# Patient Record
Sex: Female | Born: 1950 | Hispanic: No | Smoking: Never smoker
Health system: Southern US, Community
[De-identification: ages and names within clinical notes are randomized; demographics above are authoritative.]

## PROBLEM LIST (undated history)

## (undated) DIAGNOSIS — R42 Dizziness and giddiness: Secondary | ICD-10-CM

---

## 2017-12-24 ENCOUNTER — Other Ambulatory Visit: Payer: Self-pay | Admitting: Family Medicine

## 2017-12-24 DIAGNOSIS — Z1231 Encounter for screening mammogram for malignant neoplasm of breast: Secondary | ICD-10-CM

## 2018-12-03 ENCOUNTER — Other Ambulatory Visit: Payer: Self-pay | Admitting: Family Medicine

## 2018-12-03 DIAGNOSIS — Z13820 Encounter for screening for osteoporosis: Secondary | ICD-10-CM

## 2018-12-03 DIAGNOSIS — Z1231 Encounter for screening mammogram for malignant neoplasm of breast: Secondary | ICD-10-CM

## 2019-01-11 ENCOUNTER — Other Ambulatory Visit: Payer: Self-pay

## 2019-01-11 ENCOUNTER — Encounter: Payer: Self-pay | Admitting: Emergency Medicine

## 2019-01-11 DIAGNOSIS — N179 Acute kidney failure, unspecified: Secondary | ICD-10-CM | POA: Diagnosis present

## 2019-01-11 DIAGNOSIS — J9601 Acute respiratory failure with hypoxia: Secondary | ICD-10-CM | POA: Diagnosis present

## 2019-01-11 DIAGNOSIS — U071 COVID-19: Principal | ICD-10-CM | POA: Diagnosis present

## 2019-01-11 DIAGNOSIS — E860 Dehydration: Secondary | ICD-10-CM | POA: Diagnosis present

## 2019-01-11 DIAGNOSIS — Z79899 Other long term (current) drug therapy: Secondary | ICD-10-CM

## 2019-01-11 DIAGNOSIS — R8290 Unspecified abnormal findings in urine: Secondary | ICD-10-CM | POA: Diagnosis present

## 2019-01-11 DIAGNOSIS — J1282 Pneumonia due to coronavirus disease 2019: Secondary | ICD-10-CM | POA: Diagnosis present

## 2019-01-11 LAB — TROPONIN I (HIGH SENSITIVITY): Troponin I (High Sensitivity): 8 ng/L (ref <18)

## 2019-01-11 LAB — URINALYSIS, COMPLETE (UACMP) WITH MICROSCOPIC
Bilirubin Urine: NEGATIVE
Glucose, UA: NEGATIVE mg/dL
Hgb urine dipstick: NEGATIVE
Ketones, ur: NEGATIVE mg/dL
Nitrite: NEGATIVE
Protein, ur: 100 mg/dL — AB
Specific Gravity, Urine: 1.018 (ref 1.005–1.030)
pH: 6 (ref 5.0–8.0)

## 2019-01-11 LAB — BASIC METABOLIC PANEL
Anion gap: 15 (ref 5–15)
BUN: 43 mg/dL — ABNORMAL HIGH (ref 8–23)
CO2: 22 mmol/L (ref 22–32)
Calcium: 9.1 mg/dL (ref 8.9–10.3)
Chloride: 97 mmol/L — ABNORMAL LOW (ref 98–111)
Creatinine, Ser: 1.64 mg/dL — ABNORMAL HIGH (ref 0.44–1.00)
GFR calc Af Amer: 37 mL/min — ABNORMAL LOW (ref >60)
GFR calc non Af Amer: 32 mL/min — ABNORMAL LOW (ref >60)
Glucose, Bld: 107 mg/dL — ABNORMAL HIGH (ref 70–99)
Potassium: 4.4 mmol/L (ref 3.5–5.1)
Sodium: 134 mmol/L — ABNORMAL LOW (ref 135–145)

## 2019-01-11 LAB — CBC
HCT: 44.3 % (ref 36.0–46.0)
Hemoglobin: 13.9 g/dL (ref 12.0–15.0)
MCH: 25.8 pg — ABNORMAL LOW (ref 26.0–34.0)
MCHC: 31.4 g/dL (ref 30.0–36.0)
MCV: 82.3 fL (ref 80.0–100.0)
Platelets: 164 10*3/uL (ref 150–400)
RBC: 5.38 MIL/uL — ABNORMAL HIGH (ref 3.87–5.11)
RDW: 14.3 % (ref 11.5–15.5)
WBC: 4.8 10*3/uL (ref 4.0–10.5)
nRBC: 0 % (ref 0.0–0.2)

## 2019-01-11 LAB — HEPATIC FUNCTION PANEL
ALT: 27 U/L (ref 0–44)
AST: 41 U/L (ref 15–41)
Albumin: 3.9 g/dL (ref 3.5–5.0)
Alkaline Phosphatase: 64 U/L (ref 38–126)
Bilirubin, Direct: 0.1 mg/dL (ref 0.0–0.2)
Indirect Bilirubin: 0.8 mg/dL (ref 0.3–0.9)
Total Bilirubin: 0.9 mg/dL (ref 0.3–1.2)
Total Protein: 8.7 g/dL — ABNORMAL HIGH (ref 6.5–8.1)

## 2019-01-11 NOTE — ED Notes (Signed)
Patient called for a room with no answer. 

## 2019-01-11 NOTE — ED Triage Notes (Signed)
Pt arrived via POV with reports of 5 days of body aches, fever, loss of sense of smell and appetite, c/o weakness and fatigue.   Pt states was tested for COVID 2 weeks ago x 2 and was negative.

## 2019-01-12 ENCOUNTER — Encounter: Payer: Self-pay | Admitting: Internal Medicine

## 2019-01-12 ENCOUNTER — Inpatient Hospital Stay
Admission: EM | Admit: 2019-01-12 | Discharge: 2019-01-14 | DRG: 177 | Disposition: A | Discharge status: Home / Self Care | Payer: Medicare HMO | Attending: Hospitalist | Admitting: Hospitalist

## 2019-01-12 ENCOUNTER — Emergency Department: Payer: Medicare HMO

## 2019-01-12 DIAGNOSIS — Z79899 Other long term (current) drug therapy: Secondary | ICD-10-CM | POA: Diagnosis not present

## 2019-01-12 DIAGNOSIS — U071 COVID-19: Secondary | ICD-10-CM | POA: Diagnosis present

## 2019-01-12 DIAGNOSIS — E860 Dehydration: Secondary | ICD-10-CM | POA: Diagnosis present

## 2019-01-12 DIAGNOSIS — J1282 Pneumonia due to coronavirus disease 2019: Secondary | ICD-10-CM | POA: Diagnosis present

## 2019-01-12 DIAGNOSIS — R8290 Unspecified abnormal findings in urine: Secondary | ICD-10-CM | POA: Diagnosis present

## 2019-01-12 DIAGNOSIS — J9601 Acute respiratory failure with hypoxia: Secondary | ICD-10-CM | POA: Diagnosis present

## 2019-01-12 DIAGNOSIS — N179 Acute kidney failure, unspecified: Secondary | ICD-10-CM | POA: Diagnosis present

## 2019-01-12 DIAGNOSIS — J9600 Acute respiratory failure, unspecified whether with hypoxia or hypercapnia: Secondary | ICD-10-CM

## 2019-01-12 HISTORY — DX: Dizziness and giddiness: R42

## 2019-01-12 LAB — ABO/RH: ABO/RH(D): O POS

## 2019-01-12 LAB — RESPIRATORY PANEL BY RT PCR (FLU A&B, COVID)
Influenza A by PCR: NEGATIVE
Influenza B by PCR: NEGATIVE
SARS Coronavirus 2 by RT PCR: POSITIVE — AB

## 2019-01-12 LAB — POC SARS CORONAVIRUS 2 AG: SARS Coronavirus 2 Ag: POSITIVE — AB

## 2019-01-12 LAB — TROPONIN I (HIGH SENSITIVITY): Troponin I (High Sensitivity): 8 ng/L (ref <18)

## 2019-01-12 LAB — HIV ANTIBODY (ROUTINE TESTING W REFLEX): HIV Screen 4th Generation wRfx: NONREACTIVE

## 2019-01-12 LAB — CREATININE, SERUM
Creatinine, Ser: 1.76 mg/dL — ABNORMAL HIGH (ref 0.44–1.00)
GFR calc Af Amer: 34 mL/min — ABNORMAL LOW (ref >60)
GFR calc non Af Amer: 30 mL/min — ABNORMAL LOW (ref >60)

## 2019-01-12 MED ORDER — DEXAMETHASONE SODIUM PHOSPHATE 10 MG/ML IJ SOLN
6.0000 mg | INTRAMUSCULAR | Status: DC
  Administered 2019-01-13 (×2): 6 mg via INTRAVENOUS
  Filled 2019-01-12 (×2): qty 1

## 2019-01-12 MED ORDER — SODIUM CHLORIDE 0.9 % IV SOLN
100.0000 mg | Freq: Every day | INTRAVENOUS | Status: DC
  Administered 2019-01-13 – 2019-01-14 (×2): 100 mg via INTRAVENOUS
  Filled 2019-01-12 (×3): qty 20

## 2019-01-12 MED ORDER — SODIUM CHLORIDE 0.9 % IV BOLUS
1000.0000 mL | Freq: Once | INTRAVENOUS | Status: AC
  Administered 2019-01-12: 02:00:00 1000 mL via INTRAVENOUS

## 2019-01-12 MED ORDER — ENOXAPARIN SODIUM 40 MG/0.4ML ~~LOC~~ SOLN
40.0000 mg | SUBCUTANEOUS | Status: DC
  Administered 2019-01-13 (×2): 40 mg via SUBCUTANEOUS
  Filled 2019-01-12 (×2): qty 0.4

## 2019-01-12 MED ORDER — SODIUM CHLORIDE 0.9 % IV SOLN: 200.0000 mg | Freq: Once | INTRAVENOUS | Status: DC

## 2019-01-12 MED ORDER — ZINC SULFATE 220 (50 ZN) MG PO CAPS
220.0000 mg | ORAL_CAPSULE | Freq: Every day | ORAL | Status: DC
  Administered 2019-01-12 – 2019-01-14 (×3): 220 mg via ORAL
  Filled 2019-01-12 (×3): qty 1

## 2019-01-12 MED ORDER — ADULT MULTIVITAMIN W/MINERALS CH
1.0000 | ORAL_TABLET | Freq: Every day | ORAL | Status: DC
  Administered 2019-01-12 – 2019-01-14 (×3): 1 via ORAL
  Filled 2019-01-12 (×3): qty 1

## 2019-01-12 MED ORDER — SODIUM CHLORIDE 0.9 % IV BOLUS
1000.0000 mL | Freq: Once | INTRAVENOUS | Status: AC
  Administered 2019-01-12: 1000 mL via INTRAVENOUS

## 2019-01-12 MED ORDER — SODIUM CHLORIDE 0.9 % IV SOLN: 100.0000 mg | Freq: Every day | INTRAVENOUS | Status: DC

## 2019-01-12 MED ORDER — DEXAMETHASONE SODIUM PHOSPHATE 10 MG/ML IJ SOLN
10.0000 mg | Freq: Once | INTRAMUSCULAR | Status: AC
  Administered 2019-01-12: 10 mg via INTRAVENOUS
  Filled 2019-01-12: qty 1

## 2019-01-12 MED ORDER — ALBUTEROL SULFATE HFA 108 (90 BASE) MCG/ACT IN AERS
2.0000 | INHALATION_SPRAY | Freq: Four times a day (QID) | RESPIRATORY_TRACT | Status: DC
  Administered 2019-01-12 – 2019-01-14 (×9): 2 via RESPIRATORY_TRACT
  Filled 2019-01-12 (×2): qty 6.7

## 2019-01-12 MED ORDER — GUAIFENESIN-DM 100-10 MG/5ML PO SYRP
10.0000 mL | ORAL_SOLUTION | ORAL | Status: DC | PRN
  Filled 2019-01-12: qty 10

## 2019-01-12 MED ORDER — ASCORBIC ACID 500 MG PO TABS
500.0000 mg | ORAL_TABLET | Freq: Every day | ORAL | Status: DC
  Administered 2019-01-12 – 2019-01-14 (×3): 500 mg via ORAL
  Filled 2019-01-12 (×3): qty 1

## 2019-01-12 MED ORDER — SODIUM CHLORIDE 0.9 % IV SOLN
1.0000 g | INTRAVENOUS | Status: DC
  Administered 2019-01-12: 1 g via INTRAVENOUS
  Filled 2019-01-12: qty 10

## 2019-01-12 MED ORDER — SODIUM CHLORIDE 0.9 % IV SOLN
200.0000 mg | Freq: Once | INTRAVENOUS | Status: AC
  Administered 2019-01-12: 04:00:00 200 mg via INTRAVENOUS
  Filled 2019-01-12: qty 200

## 2019-01-12 NOTE — ED Notes (Signed)
Pt provided lunch tray. Sitting up at this time.

## 2019-01-12 NOTE — Progress Notes (Signed)
PROGRESS NOTE    Aimee Williamson  WUJ:[id number] DOB: 1950-02-22 DOA: 01/12/2019 PCP: Patient, No Pcp Per    Assessment & Plan:   Active Problems:   Pneumonia due to COVID-19 virus   Acute respiratory failure due to COVID-19 [facility])    Aimee Williamson is a 69 y.o. AA female with no significant past medical history who presents to the ER with a 2-week history of body aches, fever loss of smell or taste and fatigue.   Pneumonia due to COVID-19 virus Acute respiratory failure due to COVID-19 [facility]) --Pt's O2 sats varied from 88% to 95% on room air at rest, and desat easily with movement. --IV remdesivir, IV Decadron, multivitamins, albuterol --Oxygen to keep sats over 90% --Proning as tolerated  UTI, ruled out --Pt has no urinary symptoms --d/c ceftriaxone   DVT prophylaxis: Lovenox SQ Code Status: Full code  Disposition Plan: home   Subjective and Interval History:  Pt reported feeling better.  Denied dysuria.  No fever, chest pain, abdominal pain, N/V/D, increased swelling.   Objective: Vitals:   01/12/19 1339 01/12/19 1400 01/12/19 1430 01/12/19 1939  BP: 124/74 116/70 106/74   Pulse: 80 82 80   Resp: 20 (!) 23 19   Temp: 97.7 F (36.5 C)   97.9 F (36.6 C)  TempSrc: Oral   Oral  SpO2: 94% 92% 90%   Weight:      Height:        Intake/Output Summary (Last 24 hours) at 01/12/2019 2105 Last data filed at 01/12/2019 0559 Gross per 24 hour  Intake 2350 ml  Output -  Net 2350 ml   Filed Weights   01/11/19 1528  Weight: 114.8 kg    Examination:   Constitutional: NAD, AAOx3 HEENT: conjunctivae and lids normal, EOMI CV: RRR no M,R,G. Distal pulses +2.  No cyanosis.   RESP: CTA B/L, normal respiratory effort, on RA with variable O2 sats  GI: +BS, NTND Extremities: No effusions, edema, or tenderness in BLE SKIN: warm, dry and intact Neuro: II - XII grossly intact.  Sensation intact Psych: Normal mood and affect.  Appropriate judgement and reason   Data  Reviewed: I have personally reviewed following labs and imaging studies  CBC: Recent Labs  Lab 01/11/19 1546  WBC 4.8  HGB 13.9  HCT 44.3  MCV 82.3  PLT 956   Basic Metabolic Panel: Recent Labs  Lab 01/11/19 1546 01/12/19 0324  NA 134*  --   K 4.4  --   CL 97*  --   CO2 22  --   GLUCOSE 107*  --   BUN 43*  --   CREATININE 1.64* 1.76*  CALCIUM 9.1  --    GFR: Estimated Creatinine Clearance: 43.9 mL/min (A) (by C-G formula based on SCr of 1.76 mg/dL (H)). Liver Function Tests: Recent Labs  Lab 01/11/19 1546  AST 41  ALT 27  ALKPHOS 64  BILITOT 0.9  PROT 8.7*  ALBUMIN 3.9   No results for input(s): LIPASE, AMYLASE in the last 168 hours. No results for input(s): AMMONIA in the last 168 hours. Coagulation Profile: No results for input(s): INR, PROTIME in the last 168 hours. Cardiac Enzymes: No results for input(s): CKTOTAL, CKMB, CKMBINDEX, TROPONINI in the last 168 hours. BNP (last 3 results) No results for input(s): PROBNP in the last 8760 hours. HbA1C: No results for input(s): HGBA1C in the last 72 hours. CBG: No results for input(s): GLUCAP in the last 168 hours. Lipid Profile: No results  for input(s): CHOL, HDL, LDLCALC, TRIG, CHOLHDL, LDLDIRECT in the last 72 hours. Thyroid Function Tests: No results for input(s): TSH, T4TOTAL, FREET4, T3FREE, THYROIDAB in the last 72 hours. Anemia Panel: No results for input(s): VITAMINB12, FOLATE, FERRITIN, TIBC, IRON, RETICCTPCT in the last 72 hours. Sepsis Labs: No results for input(s): PROCALCITON, LATICACIDVEN in the last 168 hours.  Recent Results (from the past 240 hour(s))  Respiratory Panel by RT PCR (Flu A&B, Covid) - Nasopharyngeal Swab     Status: Abnormal   Collection Time: 01/12/19  1:20 AM   Specimen: Nasopharyngeal Swab  Result Value Ref Range Status   SARS Coronavirus 2 by RT PCR POSITIVE (A) NEGATIVE Final    Comment: RESULT CALLED TO, READ BACK BY AND VERIFIED WITH: LEIGH FERGUSON 01/12/19 AT 0308  HS    Influenza A by PCR NEGATIVE NEGATIVE Final   Influenza B by PCR NEGATIVE NEGATIVE Final    Comment: (NOTE) The Xpert Xpress SARS-CoV-2/FLU/RSV assay is intended as an aid in  the diagnosis of influenza from Nasopharyngeal swab specimens and  should not be used as a sole basis for treatment. Nasal washings and  aspirates are unacceptable for Xpert Xpress SARS-CoV-2/FLU/RSV  testing. Fact Sheet for Patients: https://www.moore.com/ Fact Sheet for Healthcare Providers: https://www.young.biz/ This test is not yet approved or cleared by the [place] FDA and  has been authorized for detection and/or diagnosis of SARS-CoV-2 by  FDA under an Emergency Use Authorization (EUA). This EUA will remain  in effect (meaning this test can be used) for the duration of the  Covid-19 declaration under Section 564(b)(1) of the Act, 21  U.S.C. section 360bbb-3(b)(1), unless the authorization is  terminated or revoked. Performed at [facility], [address], [place], [place] [address]       Radiology Studies: DG Chest Portable 1 View  Result Date: 01/12/2019 CLINICAL DATA:  69 year old female with cough and fever. EXAM: PORTABLE CHEST 1 VIEW COMPARISON:  None. FINDINGS: Bilateral streaky and somewhat nodular densities may represent edema but concerning for pneumonia, possibly atypical. Clinical correlation is recommended. No focal consolidation, large pleural effusion, or pneumothorax. The cardiac silhouette is within normal limits. There is mild fullness of the right hilum which may be related to adenopathy or confluence of structures. Attention on follow-up imaging recommended. No acute osseous pathology. IMPRESSION: 1. Findings may represent edema but concerning for atypical pneumonia. Clinical correlation and follow-up recommended 2. Right hilar density may represent adenopathy to or vascular confluence. Attention on follow-up imaging  recommended. Electronically Signed   By: Elgie Collard M.D.   On: 01/12/2019 01:57     Scheduled Meds: . albuterol  2 puff Inhalation Q6H  . vitamin C  500 mg Oral Daily  . dexamethasone (DECADRON) injection  6 mg Intravenous Q24H  . enoxaparin (LOVENOX) injection  40 mg Subcutaneous Q24H  . multivitamin with minerals  1 tablet Oral Daily  . zinc sulfate  220 mg Oral Daily   Continuous Infusions: . cefTRIAXone (ROCEPHIN)  IV Stopped (01/12/19 0559)  . [START ON 01/13/2019] remdesivir 100 mg in NS 100 mL       LOS: 0 days     Darlin Priestly, MD [facility] If 7PM-7AM, please contact night-coverage 01/12/2019, 9:05 PM

## 2019-01-12 NOTE — ED Notes (Signed)
EDP Manson Passey notified of positive test

## 2019-01-12 NOTE — ED Notes (Signed)
Pt provided phone and called Helmut Muster, daughter in law.

## 2019-01-12 NOTE — ED Notes (Signed)
Pt notified of admission- assisted to bedside commode.

## 2019-01-12 NOTE — ED Notes (Signed)
Pt asleep in bed, NAD. VSS. 

## 2019-01-12 NOTE — ED Notes (Signed)
Report given to inpatient RN.

## 2019-01-12 NOTE — H&P (Signed)
History and Physical    Aimee Williamson. Scally FUX:[id number] DOB: 06/26/1950 DOA: 01/12/2019  PCP: No primary care provider on file.   Patient coming from: home  I have personally briefly reviewed patient's old medical records in [facility]  Chief Complaint: Fatigue, fever, weakness  HPI: Aimee Williamson is a 69 y.o. female with no significant past medical history who presents to the ER with a 2-week history of body aches, fever loss of smell or taste and fatigue.  States she was tested for Covid twice a couple weeks ago and they were both negative.  She denies chest pain, nausea vomiting and diarrhea  ED Course: On arrival in the emergency room she was afebrile with temperature of 98.8 and blood pressure 107/67.  She was tachycardic at 102 tachypneic at 21 with O2 sat 88% on room air improving to the mid 90s on O2 at 2 L.  Chest x-ray showed atypical pneumonia.  WBC cell count normal.  Urinalysis was consistent with UTI.  She was started on Decadron remdesivir and hospitalist consulted for admission  Review of Systems: As per HPI otherwise 10 point review of systems negative.    Past Medical History:  Diagnosis Date  . Vertigo      The histories are not reviewed yet. Please review them in the "History" navigator section and refresh this SmartLink.   reports that she has never smoked. She has never used smokeless tobacco. No history on file for alcohol and drug.  No Known Allergies  No family history on file.   Prior to Admission medications   Not on File    Physical Exam: Vitals:   01/11/19 1805 01/12/19 0200 01/12/19 0230 01/12/19 0232  BP: 107/67 115/71 112/72   Pulse: (!) 102 80  74  Resp: 18 (!) 21 (!) 22 (!) 22  Temp: 98.8 F (37.1 C)     TempSrc: Oral     SpO2: 92% 94%  96%  Weight:      Height:         Vitals:   01/11/19 1805 01/12/19 0200 01/12/19 0230 01/12/19 0232  BP: 107/67 115/71 112/72   Pulse: (!) 102 80  74  Resp: 18 (!) 21 (!) 22 (!) 22    Temp: 98.8 F (37.1 C)     TempSrc: Oral     SpO2: 92% 94%  96%  Weight:      Height:        Constitutional: NAD, alert and oriented x 3 Eyes: PERRL, lids and conjunctivae normal ENMT: Mucous membranes are moist.  Neck: normal, supple, no masses, no thyromegaly Respiratory: Increased respiratory effort. Tachypnea, wheezes Cardiovascular: Regular rate and rhythm, no murmurs / rubs / gallops. No extremity edema. 2+ pedal pulses. No carotid bruits.  Abdomen: no tenderness, no masses palpated. No hepatosplenomegaly. Bowel sounds positive.  Musculoskeletal: no clubbing / cyanosis. No joint deformity upper and lower extremities.  Skin: no rashes, lesions, ulcers.  Neurologic: No gross focal neurologic deficit. Psychiatric: Normal mood and affect.   Labs on Admission: I have personally reviewed following labs and imaging studies  CBC: Recent Labs  Lab 01/11/19 1546  WBC 4.8  HGB 13.9  HCT 44.3  MCV 82.3  PLT 164   Basic Metabolic Panel: Recent Labs  Lab 01/11/19 1546  NA 134*  K 4.4  CL 97*  CO2 22  GLUCOSE 107*  BUN 43*  CREATININE 1.64*  CALCIUM 9.1   GFR: Estimated Creatinine Clearance: 47.1 mL/min (A) (by  C-G formula based on SCr of 1.64 mg/dL (H)). Liver Function Tests: Recent Labs  Lab 01/11/19 1546  AST 41  ALT 27  ALKPHOS 64  BILITOT 0.9  PROT 8.7*  ALBUMIN 3.9   No results for input(s): LIPASE, AMYLASE in the last 168 hours. No results for input(s): AMMONIA in the last 168 hours. Coagulation Profile: No results for input(s): INR, PROTIME in the last 168 hours. Cardiac Enzymes: No results for input(s): CKTOTAL, CKMB, CKMBINDEX, TROPONINI in the last 168 hours. BNP (last 3 results) No results for input(s): PROBNP in the last 8760 hours. HbA1C: No results for input(s): HGBA1C in the last 72 hours. CBG: No results for input(s): GLUCAP in the last 168 hours. Lipid Profile: No results for input(s): CHOL, HDL, LDLCALC, TRIG, CHOLHDL, LDLDIRECT in  the last 72 hours. Thyroid Function Tests: No results for input(s): TSH, T4TOTAL, FREET4, T3FREE, THYROIDAB in the last 72 hours. Anemia Panel: No results for input(s): VITAMINB12, FOLATE, FERRITIN, TIBC, IRON, RETICCTPCT in the last 72 hours. Urine analysis:    Component Value Date/Time   COLORURINE AMBER (A) 01/11/2019 1546   APPEARANCEUR CLOUDY (A) 01/11/2019 1546   LABSPEC 1.018 01/11/2019 1546   PHURINE 6.0 01/11/2019 1546   GLUCOSEU NEGATIVE 01/11/2019 1546   HGBUR NEGATIVE 01/11/2019 1546   BILIRUBINUR NEGATIVE 01/11/2019 1546   KETONESUR NEGATIVE 01/11/2019 1546   PROTEINUR 100 (A) 01/11/2019 1546   NITRITE NEGATIVE 01/11/2019 1546   LEUKOCYTESUR LARGE (A) 01/11/2019 1546    Radiological Exams on Admission: DG Chest Portable 1 View  Result Date: 01/12/2019 CLINICAL DATA:  69 year old female with cough and fever. EXAM: PORTABLE CHEST 1 VIEW COMPARISON:  None. FINDINGS: Bilateral streaky and somewhat nodular densities may represent edema but concerning for pneumonia, possibly atypical. Clinical correlation is recommended. No focal consolidation, large pleural effusion, or pneumothorax. The cardiac silhouette is within normal limits. There is mild fullness of the right hilum which may be related to adenopathy or confluence of structures. Attention on follow-up imaging recommended. No acute osseous pathology. IMPRESSION: 1. Findings may represent edema but concerning for atypical pneumonia. Clinical correlation and follow-up recommended 2. Right hilar density may represent adenopathy to or vascular confluence. Attention on follow-up imaging recommended. Electronically Signed   By: Anner Crete M.D.   On: 01/12/2019 01:57    EKG: Independently reviewed.   Assessment/Plan Active Problems:   Pneumonia due to COVID-19 virus   Acute respiratory failure due to COVID-19 [facility]) --IV remdesivir, IV Decadron, multivitamins, albuterol --Oxygen to keep sats over 92% --Proning as  tolerated  UTI --Rocephin 1 g every 24 -Follow urine culture    DVT prophylaxis: lovenox  Code Status: full code  Family Communication: none  Disposition Plan: Back to previous home environment Consults called: none     Athena Masse MD [facility]     01/12/2019, 2:48 AM

## 2019-01-12 NOTE — ED Notes (Signed)
Pt up to use bathroom 

## 2019-01-12 NOTE — ED Notes (Signed)
Pt is still in her regular clothing and voices that she is feeling uncomfortable.  Set pt up to get washed up at the sink.  Pt has plans to change into her pj's.  Ordered a hospital bed to be brought to pt for additional comfort.

## 2019-01-12 NOTE — ED Notes (Signed)
Pt placed on 3L at this time per MD Manson Passey

## 2019-01-12 NOTE — ED Notes (Signed)
Report given to Kate, RN

## 2019-01-12 NOTE — ED Notes (Signed)
Meal given

## 2019-01-12 NOTE — ED Notes (Signed)
Spoke to son Apolinar Junes and informed of plan of care.

## 2019-01-12 NOTE — ED Provider Notes (Signed)
[facility] Emergency Department Provider Note  ____________________________________________   First MD Initiated Contact with Patient 01/12/19 0031     (approximate)  I have reviewed the triage vital signs and the nursing notes.   HISTORY  Chief Complaint Weakness, Generalized Body Aches, and Fever    HPI Aimee Williamson is a 68 y.o. female presents to the emergency department secondary to 5-day history of generalized body aches fever loss of smell and appetite.  Patient states current pain score is 10 out of 10.  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  Patient does admit to nonproductive cough and dyspnea as well.  Patient also admits to diarrhea but no vomiting.       Past Medical History:  Diagnosis Date  . Vertigo     Patient Active Problem List   Diagnosis Date Noted  . Pneumonia due to COVID-19 virus 01/12/2019  . Acute respiratory failure due to COVID-19 [facility]) 01/12/2019      Prior to Admission medications   Medication Sig Start Date End Date Taking? Authorizing Provider  allopurinol (ZYLOPRIM) 100 MG tablet Take 100 mg by mouth daily. 12/25/18   [provider]  atorvastatin (LIPITOR) 20 MG tablet Take 20 mg by mouth daily. 09/07/18   [provider]  latanoprost (XALATAN) 0.005 % ophthalmic solution Place 1 drop into both eyes at bedtime. 10/17/18   [provider]  lisinopril-hydrochlorothiazide (ZESTORETIC) 20-25 MG tablet Take 1 tablet by mouth daily. 12/25/18   [provider]  meclizine (ANTIVERT) 12.5 MG tablet Take 12.5 mg by mouth every 8 (eight) hours as needed for dizziness. 11/30/18   [provider]    Allergies Patient has no known allergies.  No family history on file.  Social History Social History   Tobacco Use  . Smoking status: Never Smoker  . Smokeless tobacco: Never Used  Substance Use Topics  . Alcohol use:  Not on file  . Drug use: Not on file    Review of Systems Constitutional: Positive for fever/chills Eyes: No visual changes. ENT: No sore throat. Cardiovascular: Denies chest pain. Respiratory: Positive for dyspnea and cough Gastrointestinal: No abdominal pain.  No nausea, no vomiting.  Positive for diarrhea.  No constipation. Genitourinary: Negative for dysuria. Musculoskeletal: Positive for generalized muscle aches Integumentary: Negative for rash. Neurological: Negative for headaches, focal weakness or numbness.   ____________________________________________   PHYSICAL EXAM:  VITAL SIGNS: ED Triage Vitals  Enc Vitals Group     BP 01/11/19 1527 115/72     Pulse Rate 01/11/19 1527 (!) 117     Resp 01/11/19 1527 (!) 26     Temp 01/11/19 1527 98.6 F (37 C)     Temp Source 01/11/19 1527 Oral     SpO2 01/11/19 1527 95 %     Weight 01/11/19 1528 114.8 kg (253 lb)     Height 01/11/19 1528 1.803 m (5\' 11" )     Head Circumference --      Peak Flow --      Pain Score 01/11/19 1530 10     Pain Loc --      Pain Edu? --      Excl. in GC? --     Constitutional: Alert and oriented.  Eyes: Conjunctivae are normal.  Mouth/Throat: Patient is wearing a mask. Neck: No stridor.  No meningeal signs.   Cardiovascular: Normal rate, regular rhythm. Good peripheral circulation. Grossly normal heart  sounds. Respiratory: Tachypnea, diffuse rhonchi. Gastrointestinal: Soft and nontender. No distention.  Musculoskeletal: No lower extremity tenderness nor edema. No gross deformities of extremities. Neurologic:  Normal speech and language. No gross focal neurologic deficits are appreciated.  Skin:  Skin is warm, dry and intact. Psychiatric: Mood and affect are normal. Speech and behavior are normal.  ____________________________________________   LABS (all labs ordered are listed, but only abnormal results are displayed)  Labs Reviewed  RESPIRATORY PANEL BY RT PCR (FLU A&B, COVID) -  Abnormal; Notable for the following components:      Result Value   SARS Coronavirus 2 by RT PCR POSITIVE (*)    All other components within normal limits  BASIC METABOLIC PANEL - Abnormal; Notable for the following components:   Sodium 134 (*)    Chloride 97 (*)    Glucose, Bld 107 (*)    BUN 43 (*)    Creatinine, Ser 1.64 (*)    GFR calc non Af Amer 32 (*)    GFR calc Af Amer 37 (*)    All other components within normal limits  CBC - Abnormal; Notable for the following components:   RBC 5.38 (*)    MCH 25.8 (*)    All other components within normal limits  URINALYSIS, COMPLETE (UACMP) WITH MICROSCOPIC - Abnormal; Notable for the following components:   Color, Urine AMBER (*)    APPearance CLOUDY (*)    Protein, ur 100 (*)    Leukocytes,Ua LARGE (*)    Bacteria, UA RARE (*)    All other components within normal limits  HEPATIC FUNCTION PANEL - Abnormal; Notable for the following components:   Total Protein 8.7 (*)    All other components within normal limits  POC SARS CORONAVIRUS 2 AG - Abnormal; Notable for the following components:   SARS Coronavirus 2 Ag POSITIVE (*)    All other components within normal limits  HIV ANTIBODY (ROUTINE TESTING W REFLEX)  CREATININE, SERUM  POC SARS CORONAVIRUS 2 AG -  ED  ABO/RH  TROPONIN I (HIGH SENSITIVITY)  TROPONIN I (HIGH SENSITIVITY)   ____________________________________________  EKG  ED ECG REPORT I, [place] N Hitoshi Werts, the attending physician, personally viewed and interpreted this ECG.   Date: 01/11/2019  EKG Time: 3:54 PM  Rate: 111  Rhythm: Sinus tachycardia  Axis: Normal  Intervals: Normal  ST&T Change: None  ____________________________________________  RADIOLOGY I, [place] Shanetta Nicolls, personally viewed and evaluated these images (plain radiographs) as part of my medical decision making, as well as reviewing the written report by the radiologist.  ED MD interpretation: Atypical pneumonia on chest x-ray per  radiologist.  Official radiology report(s): DG Chest Portable 1 View  Result Date: 01/12/2019 CLINICAL DATA:  69 year old female with cough and fever. EXAM: PORTABLE CHEST 1 VIEW COMPARISON:  None. FINDINGS: Bilateral streaky and somewhat nodular densities may represent edema but concerning for pneumonia, possibly atypical. Clinical correlation is recommended. No focal consolidation, large pleural effusion, or pneumothorax. The cardiac silhouette is within normal limits. There is mild fullness of the right hilum which may be related to adenopathy or confluence of structures. Attention on follow-up imaging recommended. No acute osseous pathology. IMPRESSION: 1. Findings may represent edema but concerning for atypical pneumonia. Clinical correlation and follow-up recommended 2. Right hilar density may represent adenopathy to or vascular confluence. Attention on follow-up imaging recommended. Electronically Signed   By: Anner Crete M.D.   On: 01/12/2019 01:57    ____________________________________________   PROCEDURES   Procedure(s)  performed (including Critical Care):  .Critical Care Performed by: Darci Current, MD Authorized by: Darci Current, MD   Critical care provider statement:    Critical care time (minutes):  30   Critical care time was exclusive of:  Separately billable procedures and treating other patients   Critical care was necessary to treat or prevent imminent or life-threatening deterioration of the following conditions:  Respiratory failure   Critical care was time spent personally by me on the following activities:  Development of treatment plan with patient or surrogate, discussions with consultants, evaluation of patient's response to treatment, examination of patient, obtaining history from patient or surrogate, ordering and performing treatments and interventions, ordering and review of laboratory studies, ordering and review of radiographic studies, pulse  oximetry, re-evaluation of patient's condition and review of old charts     ____________________________________________   INITIAL IMPRESSION / MDM / ASSESSMENT AND PLAN / ED COURSE  As part of my medical decision making, I reviewed the following data within the electronic MEDICAL RECORD NUMBER   69 year old female presented with above-stated history and physical exam concerning for COVID-19 infection with hypoxia.  Patient Covid positive.  Chest x-ray consistent with atypical pneumonia.  Patient also given Decadron and remdesivir.  Patient discussed with Dr. Para March for hospital admission for further evaluation and management     ____________________________________________  FINAL CLINICAL IMPRESSION(S) / ED DIAGNOSES  Final diagnoses:  Pneumonia due to COVID-19 virus  Acute respiratory failure with hypoxia (HCC)     MEDICATIONS GIVEN DURING THIS VISIT:  Medications  remdesivir 200 mg in sodium chloride 0.9% 250 mL IVPB (has no administration in time range)    Followed by  remdesivir 100 mg in sodium chloride 0.9 % 100 mL IVPB (has no administration in time range)  enoxaparin (LOVENOX) injection 40 mg (has no administration in time range)  albuterol (VENTOLIN HFA) 108 (90 Base) MCG/ACT inhaler 2 puff (has no administration in time range)  dexamethasone (DECADRON) injection 6 mg (has no administration in time range)  guaiFENesin-dextromethorphan (ROBITUSSIN DM) 100-10 MG/5ML syrup 10 mL (has no administration in time range)  ascorbic acid (VITAMIN C) tablet 500 mg (has no administration in time range)  zinc sulfate capsule 220 mg (has no administration in time range)  multivitamin with minerals tablet 1 tablet (has no administration in time range)  cefTRIAXone (ROCEPHIN) 1 g in sodium chloride 0.9 % 100 mL IVPB (has no administration in time range)  dexamethasone (DECADRON) injection 10 mg (10 mg Intravenous Given 01/12/19 0154)  sodium chloride 0.9 % bolus 1,000 mL (1,000 mLs  Intravenous New Bag/Given 01/12/19 0156)  sodium chloride 0.9 % bolus 1,000 mL (1,000 mLs Intravenous New Bag/Given 01/12/19 0153)     ED Discharge Orders    None      *Please note:  Aimee Williamson was evaluated in Emergency Department on 01/12/2019 for the symptoms described in the history of present illness. She was evaluated in the context of the global COVID-19 pandemic, which necessitated consideration that the patient might be at risk for infection with the SARS-CoV-2 virus that causes COVID-19. Institutional protocols and algorithms that pertain to the evaluation of patients at risk for COVID-19 are in a state of rapid change based on information released by regulatory bodies including the CDC and federal and state organizations. These policies and algorithms were followed during the patient's care in the ED.  Some ED evaluations and interventions may be delayed as a result of limited staffing during the  pandemic.*  Note:  This document was prepared using Dragon voice recognition software and may include unintentional dictation errors.   Darci Current, MD 01/12/19 [phone]

## 2019-01-12 NOTE — ED Notes (Addendum)
Harbor Paster [phone]- daughter in law

## 2019-01-12 NOTE — ED Notes (Signed)
Son Apolinar Junes [phone]

## 2019-01-12 NOTE — ED Notes (Signed)
Pt given breakfast tray

## 2019-01-12 NOTE — ED Notes (Signed)
Requested transportation. 

## 2019-01-12 NOTE — ED Notes (Signed)
Lab coming to get repeat green top

## 2019-01-13 LAB — CBC
HCT: 40.2 % (ref 36.0–46.0)
Hemoglobin: 12.6 g/dL (ref 12.0–15.0)
MCH: 25.9 pg — ABNORMAL LOW (ref 26.0–34.0)
MCHC: 31.3 g/dL (ref 30.0–36.0)
MCV: 82.5 fL (ref 80.0–100.0)
Platelets: 180 10*3/uL (ref 150–400)
RBC: 4.87 MIL/uL (ref 3.87–5.11)
RDW: 14.1 % (ref 11.5–15.5)
WBC: 4.1 10*3/uL (ref 4.0–10.5)
nRBC: 0 % (ref 0.0–0.2)

## 2019-01-13 LAB — COMPREHENSIVE METABOLIC PANEL
ALT: 38 U/L (ref 0–44)
AST: 47 U/L — ABNORMAL HIGH (ref 15–41)
Albumin: 3.5 g/dL (ref 3.5–5.0)
Alkaline Phosphatase: 56 U/L (ref 38–126)
Anion gap: 11 (ref 5–15)
BUN: 33 mg/dL — ABNORMAL HIGH (ref 8–23)
CO2: 21 mmol/L — ABNORMAL LOW (ref 22–32)
Calcium: 9 mg/dL (ref 8.9–10.3)
Chloride: 106 mmol/L (ref 98–111)
Creatinine, Ser: 1.02 mg/dL — ABNORMAL HIGH (ref 0.44–1.00)
GFR calc Af Amer: >60 mL/min (ref >60)
GFR calc non Af Amer: 57 mL/min — ABNORMAL LOW (ref >60)
Glucose, Bld: 141 mg/dL — ABNORMAL HIGH (ref 70–99)
Potassium: 4.2 mmol/L (ref 3.5–5.1)
Sodium: 138 mmol/L (ref 135–145)
Total Bilirubin: 0.6 mg/dL (ref 0.3–1.2)
Total Protein: 8.2 g/dL — ABNORMAL HIGH (ref 6.5–8.1)

## 2019-01-13 LAB — FIBRIN DERIVATIVES D-DIMER (ARMC ONLY): Fibrin derivatives D-dimer (ARMC): 1876.01 ng/mL (FEU) — ABNORMAL HIGH (ref 0.00–499.00)

## 2019-01-13 LAB — C-REACTIVE PROTEIN: CRP: 3.4 mg/dL — ABNORMAL HIGH (ref <1.0)

## 2019-01-13 LAB — MAGNESIUM: Magnesium: 2 mg/dL (ref 1.7–2.4)

## 2019-01-13 NOTE — Plan of Care (Signed)
Patient ambulated around nurses station by NT on room air, desat in 60's on room air, taken back to room and placed on O2. Sats 90 and above on O2 acute.   Problem: Education: Goal: Knowledge of General Education information will improve Description: Including pain rating scale, medication(s)/side effects and non-pharmacologic comfort measures Outcome: Progressing   Problem: Respiratory: Goal: Will maintain a patent airway Outcome: Not Progressing

## 2019-01-13 NOTE — Progress Notes (Addendum)
PROGRESS NOTE    Aimee Williamson  AUQ:[id number] DOB: 1950/01/31 DOA: 01/12/2019 PCP: Patient, No Pcp Per    Assessment & Plan:   Active Problems:   Pneumonia due to COVID-19 virus   Acute respiratory failure due to COVID-19 [facility])    Aimee Williamson is a 68 y.o. AA female with no significant past medical history who presents to the ER with a 2-week history of body aches, fever loss of smell or taste and fatigue.   Pneumonia due to COVID-19 virus Acute respiratory failure due to COVID-19 [facility]) --Pt's O2 sats varied from 88% to 95% on room air at rest, and desat easily with movement. --IV remdesivir, IV Decadron, multivitamins, albuterol --Oxygen to keep sats over 90%  UTI, ruled out --Pt has no urinary symptoms --d/c ceftriaxone  AKI, POA, improved --Cr 1.64 on presentation due to dehydration.  Improved to 1.02 today after IVF hydration.    DVT prophylaxis: Lovenox SQ Code Status: Full code  Disposition Plan: home likely tomorrow   Subjective and Interval History:  Feeling much better.  Some mild diarrhea.  Appetite better.  No fever, dyspnea, chest pain, abdominal pain, N/V, dysuria, increased swelling.   Objective: Vitals:   01/12/19 2200 01/12/19 2223 01/13/19 0740 01/13/19 1647  BP: 124/78  (!) 148/95 (!) 158/96  Pulse: 67  64 75  Resp: 18 20 20 18   Temp:  97.8 F (36.6 C) 98.6 F (37 C)   TempSrc:  Oral Oral   SpO2: 98% 99% (!) 1% 99%  Weight:      Height:       No intake or output data in the 24 hours ending 01/13/19 1711 Filed Weights   01/11/19 1528  Weight: 114.8 kg    Examination:   Constitutional: NAD, AAOx3 HEENT: conjunctivae and lids normal, EOMI CV: RRR no M,R,G. Distal pulses +2.  No cyanosis.   RESP: CTA B/L, normal respiratory effort, on 1L GI: +BS, NTND Extremities: No effusions, edema, or tenderness in BLE SKIN: warm, dry and intact Neuro: II - XII grossly intact.  Sensation intact Psych: Normal mood and affect.  Appropriate  judgement and reason   Data Reviewed: I have personally reviewed following labs and imaging studies  CBC: Recent Labs  Lab 01/11/19 1546 01/13/19 0938  WBC 4.8 4.1  HGB 13.9 12.6  HCT 44.3 40.2  MCV 82.3 82.5  PLT 164 180   Basic Metabolic Panel: Recent Labs  Lab 01/11/19 1546 01/12/19 0324 01/13/19 0938  NA 134*  --  138  K 4.4  --  4.2  CL 97*  --  106  CO2 22  --  21*  GLUCOSE 107*  --  141*  BUN 43*  --  33*  CREATININE 1.64* 1.76* 1.02*  CALCIUM 9.1  --  9.0  MG  --   --  2.0   GFR: Estimated Creatinine Clearance: 75.7 mL/min (A) (by C-G formula based on SCr of 1.02 mg/dL (H)). Liver Function Tests: Recent Labs  Lab 01/11/19 1546 01/13/19 0938  AST 41 47*  ALT 27 38  ALKPHOS 64 56  BILITOT 0.9 0.6  PROT 8.7* 8.2*  ALBUMIN 3.9 3.5   No results for input(s): LIPASE, AMYLASE in the last 168 hours. No results for input(s): AMMONIA in the last 168 hours. Coagulation Profile: No results for input(s): INR, PROTIME in the last 168 hours. Cardiac Enzymes: No results for input(s): CKTOTAL, CKMB, CKMBINDEX, TROPONINI in the last 168 hours. BNP (last 3 results) No  results for input(s): PROBNP in the last 8760 hours. HbA1C: No results for input(s): HGBA1C in the last 72 hours. CBG: No results for input(s): GLUCAP in the last 168 hours. Lipid Profile: No results for input(s): CHOL, HDL, LDLCALC, TRIG, CHOLHDL, LDLDIRECT in the last 72 hours. Thyroid Function Tests: No results for input(s): TSH, T4TOTAL, FREET4, T3FREE, THYROIDAB in the last 72 hours. Anemia Panel: No results for input(s): VITAMINB12, FOLATE, FERRITIN, TIBC, IRON, RETICCTPCT in the last 72 hours. Sepsis Labs: No results for input(s): PROCALCITON, LATICACIDVEN in the last 168 hours.  Recent Results (from the past 240 hour(s))  Respiratory Panel by RT PCR (Flu A&B, Covid) - Nasopharyngeal Swab     Status: Abnormal   Collection Time: 01/12/19  1:20 AM   Specimen: Nasopharyngeal Swab  Result  Value Ref Range Status   SARS Coronavirus 2 by RT PCR POSITIVE (A) NEGATIVE Final    Comment: RESULT CALLED TO, READ BACK BY AND VERIFIED WITH: LEIGH FERGUSON 01/12/19 AT 0308 HS    Influenza A by PCR NEGATIVE NEGATIVE Final   Influenza B by PCR NEGATIVE NEGATIVE Final    Comment: (NOTE) The Xpert Xpress SARS-CoV-2/FLU/RSV assay is intended as an aid in  the diagnosis of influenza from Nasopharyngeal swab specimens and  should not be used as a sole basis for treatment. Nasal washings and  aspirates are unacceptable for Xpert Xpress SARS-CoV-2/FLU/RSV  testing. Fact Sheet for Patients: PinkCheek.be Fact Sheet for Healthcare Providers: GravelBags.it This test is not yet approved or cleared by the [place] FDA and  has been authorized for detection and/or diagnosis of SARS-CoV-2 by  FDA under an Emergency Use Authorization (EUA). This EUA will remain  in effect (meaning this test can be used) for the duration of the  Covid-19 declaration under Section 564(b)(1) of the Act, 21  U.S.C. section 360bbb-3(b)(1), unless the authorization is  terminated or revoked. Performed at [facility], [address], [place], [place] [address]       Radiology Studies: DG Chest Portable 1 View  Result Date: 01/12/2019 CLINICAL DATA:  69 year old female with cough and fever. EXAM: PORTABLE CHEST 1 VIEW COMPARISON:  None. FINDINGS: Bilateral streaky and somewhat nodular densities may represent edema but concerning for pneumonia, possibly atypical. Clinical correlation is recommended. No focal consolidation, large pleural effusion, or pneumothorax. The cardiac silhouette is within normal limits. There is mild fullness of the right hilum which may be related to adenopathy or confluence of structures. Attention on follow-up imaging recommended. No acute osseous pathology. IMPRESSION: 1. Findings may represent edema but concerning for  atypical pneumonia. Clinical correlation and follow-up recommended 2. Right hilar density may represent adenopathy to or vascular confluence. Attention on follow-up imaging recommended. Electronically Signed   By: Anner Crete M.D.   On: 01/12/2019 01:57     Scheduled Meds: . albuterol  2 puff Inhalation Q6H  . vitamin C  500 mg Oral Daily  . dexamethasone (DECADRON) injection  6 mg Intravenous Q24H  . enoxaparin (LOVENOX) injection  40 mg Subcutaneous Q24H  . multivitamin with minerals  1 tablet Oral Daily  . zinc sulfate  220 mg Oral Daily   Continuous Infusions: . remdesivir 100 mg in NS 100 mL 100 mg (01/13/19 0858)     LOS: 1 day     Enzo Bi, MD [facility] If 7PM-7AM, please contact night-coverage 01/13/2019, 5:11 PM

## 2019-01-13 NOTE — Progress Notes (Signed)
Ch spoke with Pt over the phone. Pt expressed that she was feeling much better today. Pt said she got here on Saturday, but got checked in on Sunday. Pt said " I would love for you to pray with me, for healing". Ch prayed with Pt. Pt was grateful for prayer and support.   01/13/19 1031  Clinical Encounter Type  Visited With Patient  Visit Type Initial;Spiritual support;Social support  Spiritual Encounters  Spiritual Needs Prayer;Emotional  Stress Factors  Patient Stress Factors Health changes

## 2019-01-14 LAB — COMPREHENSIVE METABOLIC PANEL
ALT: 33 U/L (ref 0–44)
AST: 36 U/L (ref 15–41)
Albumin: 3.5 g/dL (ref 3.5–5.0)
Alkaline Phosphatase: 54 U/L (ref 38–126)
Anion gap: 10 (ref 5–15)
BUN: 34 mg/dL — ABNORMAL HIGH (ref 8–23)
CO2: 21 mmol/L — ABNORMAL LOW (ref 22–32)
Calcium: 9.1 mg/dL (ref 8.9–10.3)
Chloride: 107 mmol/L (ref 98–111)
Creatinine, Ser: 0.77 mg/dL (ref 0.44–1.00)
GFR calc Af Amer: >60 mL/min (ref >60)
GFR calc non Af Amer: >60 mL/min (ref >60)
Glucose, Bld: 135 mg/dL — ABNORMAL HIGH (ref 70–99)
Potassium: 4.6 mmol/L (ref 3.5–5.1)
Sodium: 138 mmol/L (ref 135–145)
Total Bilirubin: 0.6 mg/dL (ref 0.3–1.2)
Total Protein: 7.7 g/dL (ref 6.5–8.1)

## 2019-01-14 LAB — CBC
HCT: 38 % (ref 36.0–46.0)
Hemoglobin: 12.2 g/dL (ref 12.0–15.0)
MCH: 26.3 pg (ref 26.0–34.0)
MCHC: 32.1 g/dL (ref 30.0–36.0)
MCV: 82.1 fL (ref 80.0–100.0)
Platelets: 194 10*3/uL (ref 150–400)
RBC: 4.63 MIL/uL (ref 3.87–5.11)
RDW: 14.2 % (ref 11.5–15.5)
WBC: 4.8 10*3/uL (ref 4.0–10.5)
nRBC: 0 % (ref 0.0–0.2)

## 2019-01-14 LAB — MAGNESIUM: Magnesium: 1.8 mg/dL (ref 1.7–2.4)

## 2019-01-14 NOTE — Progress Notes (Signed)
Patient scheduled for outpatient Remdesivir infusion at 1130AM on Wednesday 1/13 and Thursday 1/14.  Please advise them to report to [facility] at [address]  Drive to the security guard and tell them you are here for an infusion. They will direct you to the front entrance where we will come and get you.  For questions call [phone].  Thanks

## 2019-01-14 NOTE — TOC Transition Note (Signed)
Transition of Care [facility]) - CM/SW Discharge Note   Patient Details  Name: Aimee Williamson. Mellen MRN: [id number] Date of Birth: [birth year]  Transition of Care [facility]) CM/SW Contact:  Allayne Butcher, RN Phone Number: 01/14/2019, 2:42 PM   Clinical Narrative:    Patient will discharge home today.  Patient was not able to get a ride home and is not appropriate for EMS transport.  RNCM was able to contact [facility] [place] and they have arranged transportation for patient to get home today.  [facility] will also provide transportation for patient to get to her infusion tomorrow and Thursday at [facility] in [place].  No other discharge needs identified.    Final next level of care: Home/Self Care Barriers to Discharge: No Barriers Identified   Patient Goals and CMS Choice        Discharge Placement                       Discharge Plan and Services   Discharge Planning Services: CM Consult                                 Social Determinants of Health (SDOH) Interventions     Readmission Risk Interventions No flowsheet data found.

## 2019-01-14 NOTE — Discharge Summary (Signed)
Physician Discharge Summary   Aimee Williamson. Worlds  female DOB: December 25, 1950  EQA:[id number]  PCP: Patient, No Pcp Per  Admit date: 01/12/2019 Discharge date: 01/14/2019  Admitted From: home Disposition:  home CODE STATUS: Full code  Discharge Instructions    Diet - low sodium heart healthy   Complete by: As directed    Diet - low sodium heart healthy   Complete by: As directed    Discharge instructions   Complete by: As directed    You are improving, and not requiring supplemental oxygen, so you can go home to recover.  I would like you to finish your 5 days of Remdesivir, and we have arranged for transportation for you to go to the infusion to get Remdesivir on 1/13 and 1/14.  Instructions provided.  Dr. Darlin Priestly   Increase activity slowly   Complete by: As directed    Increase activity slowly   Complete by: As directed        Hospital Course:  For full details, please see H&P, progress notes, consult notes and ancillary notes.  Briefly,  Aimee Williamson Dixonis a 69 y.o.AA femalewithno significant past medical history who presents to the ER with a 2-week history of body aches, fever loss of smell or taste and fatigue.   Pneumonia due to COVID-19 virus Acute respiratory failure due to COVID-19 (HCC) Pt's O2 sats varied from 88% to 95% on room air at rest, and desat easily with movement, therefore was put on 2L initially and COVID-19 treatment started with IV remdesivir, IV Decadron, multivitamins, albuterol.  Pt improved quickly and was able to maintain O2 sat >=88% with ambulation prior to discharge.  Pt was scheduled 2 more days of Remdesivir infusion at the outpatient infusion center, with also transport arranged.    UTI, ruled out UA on presentation showed large leuk, and pt was started on ceftriaxone.  Pt denied urinary symptoms, so abx was not continued.    AKI, POA, resolved Cr 1.64 on presentation due to dehydration.  AKI resolved after IVF hydration.  Cr 0.77 on  the day of discharge.    Discharge Diagnoses:  Active Problems:   Pneumonia due to COVID-19 virus   Acute respiratory failure due to COVID-19 [facility])    Discharge Instructions:  Allergies as of 01/14/2019   No Known Allergies     Medication List    TAKE these medications   acetaminophen 500 MG tablet Commonly known as: TYLENOL Take 500 mg by mouth every 6 (six) hours as needed.   allopurinol 100 MG tablet Commonly known as: ZYLOPRIM Take 100 mg by mouth daily.   aspirin 81 MG EC tablet Take 81 mg by mouth daily.   atorvastatin 20 MG tablet Commonly known as: LIPITOR Take 20 mg by mouth daily.   Biotin 10 MG Caps Take 10 mg by mouth daily.   latanoprost 0.005 % ophthalmic solution Commonly known as: XALATAN Place 1 drop into both eyes at bedtime.   lisinopril-hydrochlorothiazide 20-25 MG tablet Commonly known as: ZESTORETIC Take 1 tablet by mouth daily.   meclizine 12.5 MG tablet Commonly known as: ANTIVERT Take 12.5 mg by mouth every 8 (eight) hours as needed for dizziness.   Vitamin B-Complex Tabs Take 1 tablet by mouth daily.         No Known Allergies   The results of significant diagnostics from this hospitalization (including imaging, microbiology, ancillary and laboratory) are listed below for reference.   Consultations:   Procedures/Studies: DG Chest Portable  1 View  Result Date: 01/12/2019 CLINICAL DATA:  69 year old female with cough and fever. EXAM: PORTABLE CHEST 1 VIEW COMPARISON:  None. FINDINGS: Bilateral streaky and somewhat nodular densities may represent edema but concerning for pneumonia, possibly atypical. Clinical correlation is recommended. No focal consolidation, large pleural effusion, or pneumothorax. The cardiac silhouette is within normal limits. There is mild fullness of the right hilum which may be related to adenopathy or confluence of structures. Attention on follow-up imaging recommended. No acute osseous pathology.  IMPRESSION: 1. Findings may represent edema but concerning for atypical pneumonia. Clinical correlation and follow-up recommended 2. Right hilar density may represent adenopathy to or vascular confluence. Attention on follow-up imaging recommended. Electronically Signed   By: Elgie Collard M.D.   On: 01/12/2019 01:57      Labs: BNP (last 3 results) No results for input(s): BNP in the last 8760 hours. Basic Metabolic Panel: Recent Labs  Lab 01/11/19 1546 01/12/19 0324 01/13/19 0938 01/14/19 0302  NA 134*  --  138 138  K 4.4  --  4.2 4.6  CL 97*  --  106 107  CO2 22  --  21* 21*  GLUCOSE 107*  --  141* 135*  BUN 43*  --  33* 34*  CREATININE 1.64* 1.76* 1.02* 0.77  CALCIUM 9.1  --  9.0 9.1  MG  --   --  2.0 1.8   Liver Function Tests: Recent Labs  Lab 01/11/19 1546 01/13/19 0938 01/14/19 0302  AST 41 47* 36  ALT 27 38 33  ALKPHOS 64 56 54  BILITOT 0.9 0.6 0.6  PROT 8.7* 8.2* 7.7  ALBUMIN 3.9 3.5 3.5   No results for input(s): LIPASE, AMYLASE in the last 168 hours. No results for input(s): AMMONIA in the last 168 hours. CBC: Recent Labs  Lab 01/11/19 1546 01/13/19 0938 01/14/19 0302  WBC 4.8 4.1 4.8  HGB 13.9 12.6 12.2  HCT 44.3 40.2 38.0  MCV 82.3 82.5 82.1  PLT 164 180 194   Cardiac Enzymes: No results for input(s): CKTOTAL, CKMB, CKMBINDEX, TROPONINI in the last 168 hours. BNP: Invalid input(s): POCBNP CBG: No results for input(s): GLUCAP in the last 168 hours. D-Dimer No results for input(s): DDIMER in the last 72 hours. Hgb A1c No results for input(s): HGBA1C in the last 72 hours. Lipid Profile No results for input(s): CHOL, HDL, LDLCALC, TRIG, CHOLHDL, LDLDIRECT in the last 72 hours. Thyroid function studies No results for input(s): TSH, T4TOTAL, T3FREE, THYROIDAB in the last 72 hours.  Invalid input(s): FREET3 Anemia work up No results for input(s): VITAMINB12, FOLATE, FERRITIN, TIBC, IRON, RETICCTPCT in the last 72 hours. Urinalysis      Component Value Date/Time   COLORURINE AMBER (A) 01/11/2019 1546   APPEARANCEUR CLOUDY (A) 01/11/2019 1546   LABSPEC 1.018 01/11/2019 1546   PHURINE 6.0 01/11/2019 1546   GLUCOSEU NEGATIVE 01/11/2019 1546   HGBUR NEGATIVE 01/11/2019 1546   BILIRUBINUR NEGATIVE 01/11/2019 1546   KETONESUR NEGATIVE 01/11/2019 1546   PROTEINUR 100 (A) 01/11/2019 1546   NITRITE NEGATIVE 01/11/2019 1546   LEUKOCYTESUR LARGE (A) 01/11/2019 1546   Sepsis Labs Invalid input(s): PROCALCITONIN,  WBC,  LACTICIDVEN Microbiology Recent Results (from the past 240 hour(s))  Respiratory Panel by RT PCR (Flu A&B, Covid) - Nasopharyngeal Swab     Status: Abnormal   Collection Time: 01/12/19  1:20 AM   Specimen: Nasopharyngeal Swab  Result Value Ref Range Status   SARS Coronavirus 2 by RT PCR POSITIVE (A) NEGATIVE Final  Comment: RESULT CALLED TO, READ BACK BY AND VERIFIED WITH: LEIGH FERGUSON 01/12/19 AT 0308 HS    Influenza A by PCR NEGATIVE NEGATIVE Final   Influenza B by PCR NEGATIVE NEGATIVE Final    Comment: (NOTE) The Xpert Xpress SARS-CoV-2/FLU/RSV assay is intended as an aid in  the diagnosis of influenza from Nasopharyngeal swab specimens and  should not be used as a sole basis for treatment. Nasal washings and  aspirates are unacceptable for Xpert Xpress SARS-CoV-2/FLU/RSV  testing. Fact Sheet for Patients: PinkCheek.be Fact Sheet for Healthcare Providers: GravelBags.it This test is not yet approved or cleared by the [place] FDA and  has been authorized for detection and/or diagnosis of SARS-CoV-2 by  FDA under an Emergency Use Authorization (EUA). This EUA will remain  in effect (meaning this test can be used) for the duration of the  Covid-19 declaration under Section 564(b)(1) of the Act, 21  U.S.C. section 360bbb-3(b)(1), unless the authorization is  terminated or revoked. Performed at [facility], [address] [place],  [address]      Total time spend on discharging this patient, including the last patient exam, discussing the hospital stay, instructions for ongoing care as it relates to all pertinent caregivers, as well as preparing the medical discharge records, prescriptions, and/or referrals as applicable, is 45 minutes.    Enzo Bi, MD  [facility] 01/16/2019, 11:01 PM  If 7PM-7AM, please contact night-coverage

## 2019-01-15 ENCOUNTER — Ambulatory Visit (HOSPITAL_COMMUNITY)
Admission: RE | Admit: 2019-01-15 | Discharge: 2019-01-15 | Disposition: A | Discharge status: Home / Self Care | Payer: Medicare HMO | Source: Ambulatory Visit | Attending: Pulmonary Disease | Admitting: Pulmonary Disease

## 2019-01-15 DIAGNOSIS — J1282 Pneumonia due to coronavirus disease 2019: Secondary | ICD-10-CM | POA: Diagnosis not present

## 2019-01-15 DIAGNOSIS — U071 COVID-19: Secondary | ICD-10-CM | POA: Insufficient documentation

## 2019-01-15 MED ORDER — EPINEPHRINE 0.3 MG/0.3ML IJ SOAJ: 0.3000 mg | Freq: Once | INTRAMUSCULAR | Status: DC | PRN

## 2019-01-15 MED ORDER — ALBUTEROL SULFATE HFA 108 (90 BASE) MCG/ACT IN AERS: 2.0000 | INHALATION_SPRAY | Freq: Once | RESPIRATORY_TRACT | Status: DC | PRN

## 2019-01-15 MED ORDER — DIPHENHYDRAMINE HCL 50 MG/ML IJ SOLN: 50.0000 mg | Freq: Once | INTRAMUSCULAR | Status: DC | PRN

## 2019-01-15 MED ORDER — SODIUM CHLORIDE 0.9 % IV SOLN
INTRAVENOUS | Status: AC
  Administered 2019-01-15: 100 mg via INTRAVENOUS
  Filled 2019-01-15: qty 20

## 2019-01-15 MED ORDER — METHYLPREDNISOLONE SODIUM SUCC 125 MG IJ SOLR: 125.0000 mg | Freq: Once | INTRAMUSCULAR | Status: DC | PRN

## 2019-01-15 MED ORDER — SODIUM CHLORIDE 0.9 % IV SOLN: INTRAVENOUS | Status: DC | PRN

## 2019-01-15 MED ORDER — FAMOTIDINE IN NACL 20-0.9 MG/50ML-% IV SOLN: 20.0000 mg | Freq: Once | INTRAVENOUS | Status: DC | PRN

## 2019-01-15 MED ORDER — SODIUM CHLORIDE 0.9 % IV SOLN: 100.0000 mg | Freq: Once | INTRAVENOUS | Status: AC

## 2019-01-15 NOTE — Progress Notes (Signed)
  Diagnosis: COVID-19  Physician: Dr. Delford Field Procedure: [facility] Infusion [facility] Med: remdesivir infusion.  Complications: No immediate complications noted.  Discharge: Discharged home   Aimee Williamson L 01/15/2019

## 2019-01-16 ENCOUNTER — Ambulatory Visit (HOSPITAL_COMMUNITY)
Admit: 2019-01-16 | Discharge: 2019-01-16 | Disposition: A | Discharge status: Home / Self Care | Payer: Medicare HMO | Attending: Pulmonary Disease | Admitting: Pulmonary Disease

## 2019-01-16 DIAGNOSIS — U071 COVID-19: Secondary | ICD-10-CM | POA: Diagnosis not present

## 2019-01-16 MED ORDER — SODIUM CHLORIDE 0.9 % IV SOLN: INTRAVENOUS | Status: DC | PRN

## 2019-01-16 MED ORDER — FAMOTIDINE IN NACL 20-0.9 MG/50ML-% IV SOLN: 20.0000 mg | Freq: Once | INTRAVENOUS | Status: DC | PRN

## 2019-01-16 MED ORDER — SODIUM CHLORIDE 0.9 % IV SOLN
INTRAVENOUS | Status: AC
  Filled 2019-01-16: qty 20

## 2019-01-16 MED ORDER — EPINEPHRINE 0.3 MG/0.3ML IJ SOAJ: 0.3000 mg | Freq: Once | INTRAMUSCULAR | Status: DC | PRN

## 2019-01-16 MED ORDER — SODIUM CHLORIDE 0.9 % IV SOLN
100.0000 mg | Freq: Once | INTRAVENOUS | Status: AC
  Administered 2019-01-16: 100 mg via INTRAVENOUS

## 2019-01-16 MED ORDER — METHYLPREDNISOLONE SODIUM SUCC 125 MG IJ SOLR: 125.0000 mg | Freq: Once | INTRAMUSCULAR | Status: DC | PRN

## 2019-01-16 MED ORDER — ALBUTEROL SULFATE HFA 108 (90 BASE) MCG/ACT IN AERS: 2.0000 | INHALATION_SPRAY | Freq: Once | RESPIRATORY_TRACT | Status: DC | PRN

## 2019-01-16 MED ORDER — DIPHENHYDRAMINE HCL 50 MG/ML IJ SOLN: 50.0000 mg | Freq: Once | INTRAMUSCULAR | Status: DC | PRN

## 2019-01-16 NOTE — Progress Notes (Addendum)
  Diagnosis: COVID-19  Physician: Dr. Wright  Procedure: Covid Infusion Clinic Med: remdesivir infusion.  Complications: No immediate complications noted.  Discharge: Discharged home   Sher Hellinger N Kao Conry 01/16/2019  

## 2019-01-29 ENCOUNTER — Other Ambulatory Visit: Payer: Self-pay

## 2019-09-15 ENCOUNTER — Other Ambulatory Visit: Payer: Self-pay | Admitting: Family Medicine

## 2019-09-15 DIAGNOSIS — Z1231 Encounter for screening mammogram for malignant neoplasm of breast: Secondary | ICD-10-CM

## 2019-12-08 ENCOUNTER — Other Ambulatory Visit: Payer: Self-pay | Admitting: Family Medicine

## 2019-12-08 DIAGNOSIS — Z1231 Encounter for screening mammogram for malignant neoplasm of breast: Secondary | ICD-10-CM

## 2019-12-08 DIAGNOSIS — Z13820 Encounter for screening for osteoporosis: Secondary | ICD-10-CM

## 2020-04-22 ENCOUNTER — Other Ambulatory Visit: Payer: Medicare HMO

## 2020-05-03 ENCOUNTER — Other Ambulatory Visit: Payer: Medicare HMO

## 2020-09-02 ENCOUNTER — Other Ambulatory Visit: Payer: Self-pay | Admitting: Family Medicine

## 2020-09-02 DIAGNOSIS — Z1231 Encounter for screening mammogram for malignant neoplasm of breast: Secondary | ICD-10-CM

## 2021-05-10 ENCOUNTER — Other Ambulatory Visit: Payer: Self-pay | Admitting: Family Medicine

## 2021-05-10 DIAGNOSIS — Z1231 Encounter for screening mammogram for malignant neoplasm of breast: Secondary | ICD-10-CM

## 2021-06-14 ENCOUNTER — Ambulatory Visit
Admission: RE | Admit: 2021-06-14 | Discharge: 2021-06-14 | Disposition: A | Discharge status: Home / Self Care | Payer: Medicare HMO | Source: Ambulatory Visit | Attending: Family Medicine | Admitting: Family Medicine

## 2021-06-14 DIAGNOSIS — Z1231 Encounter for screening mammogram for malignant neoplasm of breast: Secondary | ICD-10-CM | POA: Diagnosis present

## 2022-03-06 ENCOUNTER — Other Ambulatory Visit: Payer: Self-pay | Admitting: Student

## 2022-06-05 ENCOUNTER — Other Ambulatory Visit: Payer: Self-pay | Admitting: Family Medicine

## 2022-06-05 DIAGNOSIS — Z1231 Encounter for screening mammogram for malignant neoplasm of breast: Secondary | ICD-10-CM

## 2022-06-19 ENCOUNTER — Ambulatory Visit
Admission: RE | Admit: 2022-06-19 | Discharge: 2022-06-19 | Disposition: A | Discharge status: Home / Self Care | Payer: Medicare HMO | Source: Ambulatory Visit | Attending: Family Medicine | Admitting: Family Medicine

## 2022-06-19 DIAGNOSIS — Z1231 Encounter for screening mammogram for malignant neoplasm of breast: Secondary | ICD-10-CM | POA: Insufficient documentation

## 2023-10-18 ENCOUNTER — Other Ambulatory Visit: Payer: Self-pay

## 2023-10-18 MED ORDER — FLUZONE HIGH-DOSE 0.5 ML IM SUSY
0.5000 mL | PREFILLED_SYRINGE | Freq: Once | INTRAMUSCULAR | 0 refills | Status: AC
  Filled 2023-10-18: qty 0.5, 1d supply, fill #0

## 2023-10-25 ENCOUNTER — Other Ambulatory Visit: Payer: Self-pay | Admitting: Family Medicine

## 2023-10-25 DIAGNOSIS — Z1231 Encounter for screening mammogram for malignant neoplasm of breast: Secondary | ICD-10-CM

## 2023-11-27 ENCOUNTER — Encounter

## 2024-01-17 ENCOUNTER — Ambulatory Visit
Admission: RE | Admit: 2024-01-17 | Discharge: 2024-01-17 | Disposition: A | Discharge status: Home / Self Care | Source: Ambulatory Visit | Attending: Family Medicine | Admitting: Family Medicine

## 2024-01-17 DIAGNOSIS — Z1231 Encounter for screening mammogram for malignant neoplasm of breast: Secondary | ICD-10-CM | POA: Insufficient documentation

## 2024-01-31 ENCOUNTER — Emergency Department
Admission: EM | Admit: 2024-01-31 | Discharge: 2024-01-31 | Disposition: A | Discharge status: Home / Self Care | Attending: Emergency Medicine | Admitting: Emergency Medicine

## 2024-01-31 ENCOUNTER — Emergency Department

## 2024-01-31 ENCOUNTER — Other Ambulatory Visit: Payer: Self-pay

## 2024-01-31 ENCOUNTER — Ambulatory Visit
Admission: EM | Admit: 2024-01-31 | Discharge: 2024-01-31 | Disposition: A | Discharge status: Home / Self Care | Attending: Emergency Medicine | Admitting: Emergency Medicine

## 2024-01-31 DIAGNOSIS — R0789 Other chest pain: Secondary | ICD-10-CM | POA: Insufficient documentation

## 2024-01-31 DIAGNOSIS — R9431 Abnormal electrocardiogram [ECG] [EKG]: Secondary | ICD-10-CM | POA: Insufficient documentation

## 2024-01-31 DIAGNOSIS — N644 Mastodynia: Secondary | ICD-10-CM | POA: Diagnosis present

## 2024-01-31 LAB — CBC
HCT: 40.5 % (ref 36.0–46.0)
Hemoglobin: 13.3 g/dL (ref 12.0–15.0)
MCH: 27 pg (ref 26.0–34.0)
MCHC: 32.8 g/dL (ref 30.0–36.0)
MCV: 82.2 fL (ref 80.0–100.0)
Platelets: 151 10*3/uL (ref 150–400)
RBC: 4.93 MIL/uL (ref 3.87–5.11)
RDW: 13.9 % (ref 11.5–15.5)
WBC: 7.1 10*3/uL (ref 4.0–10.5)
nRBC: 0 % (ref 0.0–0.2)

## 2024-01-31 LAB — BASIC METABOLIC PANEL WITH GFR
Anion gap: 15 (ref 5–15)
BUN: 26 mg/dL — ABNORMAL HIGH (ref 8–23)
CO2: 21 mmol/L — ABNORMAL LOW (ref 22–32)
Calcium: 9.7 mg/dL (ref 8.9–10.3)
Chloride: 105 mmol/L (ref 98–111)
Creatinine, Ser: 1.15 mg/dL — ABNORMAL HIGH (ref 0.44–1.00)
GFR, Estimated: 50 mL/min — ABNORMAL LOW
Glucose, Bld: 88 mg/dL (ref 70–99)
Potassium: 4 mmol/L (ref 3.5–5.1)
Sodium: 140 mmol/L (ref 135–145)

## 2024-01-31 LAB — TROPONIN T, HIGH SENSITIVITY
Troponin T High Sensitivity: 22 ng/L — ABNORMAL HIGH (ref 0–19)
Troponin T High Sensitivity: 22 ng/L — ABNORMAL HIGH (ref 0–19)

## 2024-01-31 LAB — LACTIC ACID, PLASMA: Lactic Acid, Venous: 1.4 mmol/L (ref 0.5–1.9)

## 2024-01-31 MED ORDER — NAPROXEN 375 MG PO TBEC: 1.0000 | DELAYED_RELEASE_TABLET | Freq: Two times a day (BID) | ORAL | 0 refills | Status: AC | PRN

## 2024-01-31 MED ORDER — IOHEXOL 350 MG/ML SOLN
100.0000 mL | Freq: Once | INTRAVENOUS | Status: AC | PRN
  Administered 2024-01-31: 100 mL via INTRAVENOUS

## 2024-01-31 NOTE — ED Triage Notes (Signed)
 Pt states she started having right breast pain that goes down her right arm to her elbow and right shoulder x 2 weeks. Pt states she had a mammogram done 2 weeks ago and started having pain 2 days after that hasn't gone away. Taking tylenol and ibuprofen.

## 2024-01-31 NOTE — ED Notes (Addendum)
 Patient is being discharged from the Urgent Care and sent to the Emergency Department via PV . Per provider Burnard T., patient is in need of higher level of care due to breast pain needing further testing. Patient is aware and verbalizes understanding of plan of care.  Vitals:   01/31/24 1211 01/31/24 1224  BP: (!) 156/75 124/79  Pulse: 79   Resp: 18   Temp: 97.9 F (36.6 C)   SpO2: 98%

## 2024-01-31 NOTE — Discharge Instructions (Signed)
 Go to the emergency department for evaluation of your abnormal EKG and right breast pain.

## 2024-01-31 NOTE — Discharge Instructions (Addendum)
 Your pain is likely muscular.  You may try taking naproxen  up to twice daily.  Follow-up with your primary care provider.  In the meantime, return to the ER for new, worsening, or persistent severe chest pain, difficulty breathing, weakness or lightheadedness, or any other new or worsening symptoms that concern you.  Your CT shows a small nodule in the left lower lobe of your lung.  You should follow-up with your doctor about this to have repeat CT scan in 6 to 12 months.

## 2024-01-31 NOTE — ED Notes (Signed)
 Called lab due to repeat troponin not being ran yet. Tech found the blood and they are going to run it now

## 2024-01-31 NOTE — ED Provider Notes (Signed)
 " [facility]    CSN: [id number] Arrival date & time: 01/31/24  1150      History   Chief Complaint Chief Complaint  Patient presents with   Breast Pain    HPI Aimee Williamson. Nolton is a 74 y.o. female.  Patient presents with right breast pain x 2 weeks.  The breast pain radiates to her right arm and right upper back.  Her symptoms started 2 days after she had a mammogram on 01/17/2024.  Her chest and upper back are sore to touch.  No bruising, redness, sores, swelling.  No chest pain, shortness of breath, numbness, weakness.  She has attempted treatment with Tylenol and ibuprofen.  The history is provided by the patient and medical records.    Past Medical History:  Diagnosis Date   Vertigo     Patient Active Problem List   Diagnosis Date Noted   Pneumonia due to COVID-19 virus 01/12/2019   Acute respiratory failure due to COVID-19 [facility]) 01/12/2019    History reviewed. No pertinent surgical history.  OB History   No obstetric history on file.      Home Medications    Prior to Admission medications  Medication Sig Start Date End Date Taking? Authorizing Provider  acetaminophen (TYLENOL) 500 MG tablet Take 500 mg by mouth every 6 (six) hours as needed.   Yes [provider]  allopurinol (ZYLOPRIM) 100 MG tablet Take 100 mg by mouth daily. 12/25/18  Yes [provider]  aspirin 81 MG EC tablet Take 81 mg by mouth daily.   Yes [provider]  atorvastatin (LIPITOR) 20 MG tablet Take 20 mg by mouth daily. 09/07/18  Yes [provider]  B Complex Vitamins (VITAMIN B-COMPLEX) TABS Take 1 tablet by mouth daily.   Yes [provider]  Biotin 10 MG CAPS Take 10 mg by mouth daily.   Yes [provider]  lisinopril-hydrochlorothiazide (ZESTORETIC) 20-25 MG tablet Take 1 tablet by mouth daily. 12/25/18  Yes [provider]  meclizine (ANTIVERT) 12.5 MG tablet Take 12.5 mg by mouth every 8 (eight) hours as needed  for dizziness. 11/30/18  Yes [provider]  meloxicam (MOBIC) 15 MG tablet SMARTSIG:1 Tablet(s) By Mouth   Yes [provider]  latanoprost (XALATAN) 0.005 % ophthalmic solution Place 1 drop into both eyes at bedtime. 10/17/18   [provider]    Family History History reviewed. No pertinent family history.  Social History Social History[1]   Allergies   Patient has no known allergies.   Review of Systems Review of Systems  Constitutional:  Negative for chills and fever.  Respiratory:  Negative for cough and shortness of breath.   Cardiovascular:  Positive for chest pain. Negative for palpitations.  Musculoskeletal:  Positive for back pain and myalgias.       Right breast and upper chest pain  Skin:  Negative for color change, rash and wound.  Neurological:  Negative for weakness and numbness.     Physical Exam Triage Vital Signs ED Triage Vitals  Encounter Vitals Group     BP 01/31/24 1211 (!) 156/75     Girls Systolic BP Percentile --      Girls Diastolic BP Percentile --      Boys Systolic BP Percentile --      Boys Diastolic BP Percentile --      Pulse Rate 01/31/24 1211 79     Resp 01/31/24 1211 18     Temp 01/31/24 1211  97.9 F (36.6 C)     Temp src --      SpO2 01/31/24 1211 98 %     Weight --      Height --      Head Circumference --      Peak Flow --      Pain Score 01/31/24 1221 10     Pain Loc --      Pain Education --      Exclude from Growth Chart --    No data found.  Updated Vital Signs BP 124/79 (BP Location: Left Arm)   Pulse 79   Temp 97.9 F (36.6 C)   Resp 18   SpO2 98%   Visual Acuity Right Eye Distance:   Left Eye Distance:   Bilateral Distance:    Right Eye Near:   Left Eye Near:    Bilateral Near:     Physical Exam Constitutional:      General: She is not in acute distress. HENT:     Mouth/Throat:     Mouth: Mucous membranes are moist.  Cardiovascular:     Rate and Rhythm: Normal rate.  Rhythm irregular.     Heart sounds: Normal heart sounds.  Pulmonary:     Effort: Pulmonary effort is normal. No respiratory distress.     Breath sounds: Normal breath sounds.  Musculoskeletal:        General: Tenderness present. No swelling or deformity. Normal range of motion.     Comments: Right upper breast and chest area tender to palpation.  Right upper back muscles tender to palpation.  Spine nontender.  Skin:    General: Skin is warm and dry.     Findings: No bruising, erythema, lesion or rash.  Neurological:     General: No focal deficit present.     Mental Status: She is alert.     Sensory: No sensory deficit.     Motor: No weakness.     Gait: Gait normal.      UC Treatments / Results  Labs (all labs ordered are listed, but only abnormal results are displayed) Labs Reviewed - No data to display  EKG   Radiology No results found.  Procedures Procedures (including critical care time)  Medications Ordered in UC Medications - No data to display  Initial Impression / Assessment and Plan / UC Course  I have reviewed the triage vital signs and the nursing notes.  Pertinent labs & imaging results that were available during my care of the patient were reviewed by me and considered in my medical decision making (see chart for details).    Abnormal EKG, right breast pain.  Patient's breast pain started 2 days after she had a mammogram on 01/17/2024.  Her upper chest and back are tender to palpation.  However, her heart rhythm is irregular and EKG shows changes when compared to most recent in chart from 2021.  EKG shows sinus tachycardia with PVCs, rate 106, no ST elevation, changed from previous on 01/14/2019.  Patient's chart shows that her PCP made a referral to cardiology on 11/09/2023 for abnormal EKG but no other information available in chart.  Based on abnormal EKG and right upper chest/breast pain, sending patient to the ED for evaluation.  She is agreeable to this and  will go to [facility] ED now.  Final Clinical Impressions(s) / UC Diagnoses   Final diagnoses:  Abnormal EKG  Breast pain, right     Discharge Instructions  Go to the emergency department for evaluation of your abnormal EKG and right breast pain.     ED Prescriptions   None    PDMP not reviewed this encounter.    [1]  Social History Tobacco Use   Smoking status: Never   Smokeless tobacco: Never  Vaping Use   Vaping status: Never Used  Substance Use Topics   Alcohol use: Never   Drug use: Never     Corlis Burnard DEL, NP 01/31/24 1310  "

## 2024-01-31 NOTE — ED Notes (Signed)
 Pt given discharge paperwork and instructions. Pt verbalized understanding. Pt ambulated out of ED with a steady gait in no acute distress. Pt alert and oriented x4

## 2024-01-31 NOTE — ED Provider Notes (Signed)
 "  [facility] Provider Note    Event Date/Time   First MD Initiated Contact with Patient 01/31/24 1739     (approximate)   History   Abnormal ECG and Breast Pain   HPI  Aimee Williamson is a 74 y.o. female with a history of vertigo who presents with chest pain, sent in from urgent care.  The patient states that she has had right-sided chest pain for close in the last 2 weeks, starting about 2 days ago after having a breast exam.  The pain is in the upper part of her chest above her breast, going under her armpit and somewhat into the right arm.  She also feels it in the back at times.  The pain is mostly constant although it differs in intensity.  It is not exertional.  It is somewhat positional.  She states that a few days ago when she did some arm exercises it helped the pain for a little while, although then it came back.  She reports some associated shortness of breath.  She denies feeling dizzy or lightheaded.  She has no leg swelling.  She denies any cough or fever.  I reviewed the past medical records.  The patient was seen at urgent care today with the right breast pain.  EKG was noted to show sinus tachycardia to the low 100s with frequent PVCs, change from her most recent EKG in 2021.   Physical Exam   Triage Vital Signs: ED Triage Vitals  Encounter Vitals Group     BP 01/31/24 1346 (!) 139/92     Girls Systolic BP Percentile --      Girls Diastolic BP Percentile --      Boys Systolic BP Percentile --      Boys Diastolic BP Percentile --      Pulse Rate 01/31/24 1346 61     Resp 01/31/24 1346 17     Temp 01/31/24 1754 98.6 F (37 C)     Temp Source 01/31/24 1754 Oral     SpO2 01/31/24 1343 99 %     Weight 01/31/24 1344 265 lb (120.2 kg)     Height 01/31/24 1344 5' 11 (1.803 m)     Head Circumference --      Peak Flow --      Pain Score 01/31/24 1343 10     Pain Loc --      Pain Education --      Exclude from Growth Chart --     Most  recent vital signs: Vitals:   01/31/24 1754 01/31/24 2003  BP:  (!) 144/89  Pulse:  95  Resp:  18  Temp: 98.6 F (37 C)   SpO2:  100%     General: Alert, well-appearing, no distress.  CV:  Good peripheral perfusion.  Irregular heart rate, otherwise normal heart sounds. Resp:  Normal effort.  Lungs CTAB. Abd:  No distention.  Other:  No peripheral edema.   ED Results / Procedures / Treatments   Labs (all labs ordered are listed, but only abnormal results are displayed) Labs Reviewed  BASIC METABOLIC PANEL WITH GFR - Abnormal; Notable for the following components:      Result Value   CO2 21 (*)    BUN 26 (*)    Creatinine, Ser 1.15 (*)    GFR, Estimated 50 (*)    All other components within normal limits  TROPONIN T, HIGH SENSITIVITY - Abnormal; Notable for the following components:  Troponin T High Sensitivity 22 (*)    All other components within normal limits  TROPONIN T, HIGH SENSITIVITY - Abnormal; Notable for the following components:   Troponin T High Sensitivity 22 (*)    All other components within normal limits  CBC  LACTIC ACID, PLASMA     EKG  ED ECG REPORT I, Waylon Cassis, the attending physician, personally viewed and interpreted this ECG.  Date: 01/31/2024 EKG Time: 1347 Rate: 112 Rhythm: Sinus tachycardia with frequent PVCs QRS Axis: normal Intervals: normal ST/T Wave abnormalities: Nonspecific T wave abnormalities Narrative Interpretation: no evidence of acute ischemia    RADIOLOGY  Chest x-ray: I independently viewed and interpreted the images; there is no focal consolidation or edema  CTA chest:  IMPRESSION:  1. No evidence of pulmonary embolism.  2. 7 mm sub-solid nodule in the left lower lobe; recommend non-contrast chest  CT at 6-12 months to confirm persistence, then CT every 2 years until 5 years,  as per Fleischner Society Guidelines.    PROCEDURES:  Critical Care performed: No  Procedures   MEDICATIONS  ORDERED IN ED: Medications  iohexol  (OMNIPAQUE ) 350 MG/ML injection 100 mL (100 mLs Intravenous Contrast Given 01/31/24 1908)     IMPRESSION / MDM / ASSESSMENT AND PLAN / ED COURSE  I reviewed the triage vital signs and the nursing notes.  75 year old female with PMH as noted above presents with atypical, nonexertional chest pain.  She was seen in urgent care and sent in due to abnormal EKG findings of tachycardia and PVCs.  On exam the patient is well-appearing.  Her vital signs are normal here for hypertension, although she was initially also tachycardic.  Physical exam is unremarkable for acute findings.  Differential diagnosis includes, but is not limited to, musculoskeletal chest wall pain, GERD, radiculopathy, other benign etiology, less likely ACS.  I have a lower suspicion for PE.  There is no evidence of aortic dissection or other vascular cause.  Patient's presentation is most consistent with acute complicated illness / injury requiring diagnostic workup.  In terms of the initial ED workup, the EKG shows PVCs and the patient was slightly tachycardic at that time but there are no ischemic findings.  Chest x-ray is clear.  Troponins are just above normal cutoff, so not clinically significant.  BMP and CBC are unremarkable.  Given the tachycardia, the persistent chest pain, and the shortness of breath, as well as the patient's age, PE is high enough on the differential that we will obtain a CTA, as D-dimer likely will not be an effective rule out and this age group.  ----------------------------------------- 8:16 PM on 01/31/2024 -----------------------------------------  CTA is negative.  The patient remains comfortable appearing.  She is stable for discharge at this time.  I counseled her on the results of the workup including the nodules seen on the CT, and I answered all of her questions..  I gave strict return precautions and she expressed understanding.   FINAL CLINICAL  IMPRESSION(S) / ED DIAGNOSES   Final diagnoses:  Atypical chest pain     Rx / DC Orders   ED Discharge Orders          Ordered    Naproxen  375 MG TBEC  2 times daily PRN        01/31/24 2015             Note:  This document was prepared using Dragon voice recognition software and may include unintentional dictation errors.    Markanthony Gedney,  [place], MD 01/31/24 2016  "

## 2024-01-31 NOTE — ED Triage Notes (Signed)
 Pt comes in via pov after being seen at urgent care. Pt had an EKG while there. She was sent over for a repeat EKG, and further evaluation. Pt complains of pain in her breast since Jan 17 after having a breast exam performed. Pt complains of right breast pain, right under arm pain, and right shoulder pain. Pt complains of pain 10/10. PT is alert and oriented x4.

## 2024-06-03 IMAGING — MG MM DIGITAL SCREENING BILAT W/ TOMO AND CAD
6 of 10 series · 6 of 30 positions shown · non-contrast
Comparison: None available.

CLINICAL DATA: Screening. New baseline examination.

EXAM:
DIGITAL SCREENING BILATERAL MAMMOGRAM WITH TOMOSYNTHESIS AND CAD
TECHNIQUE: Bilateral screening digital craniocaudal and mediolateral oblique
mammograms were obtained. Bilateral screening digital breast
tomosynthesis was performed. The images were evaluated with
computer-aided detection.

[L MLO synth-2D]
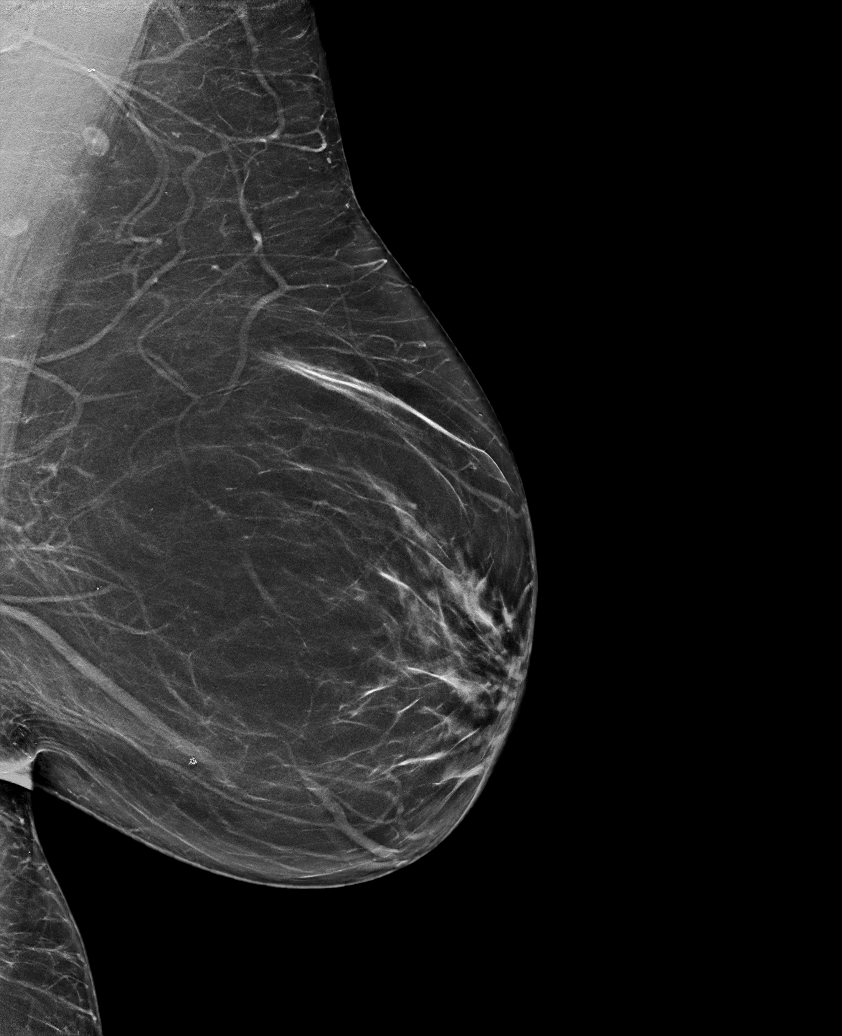

[R CC synth-2D]
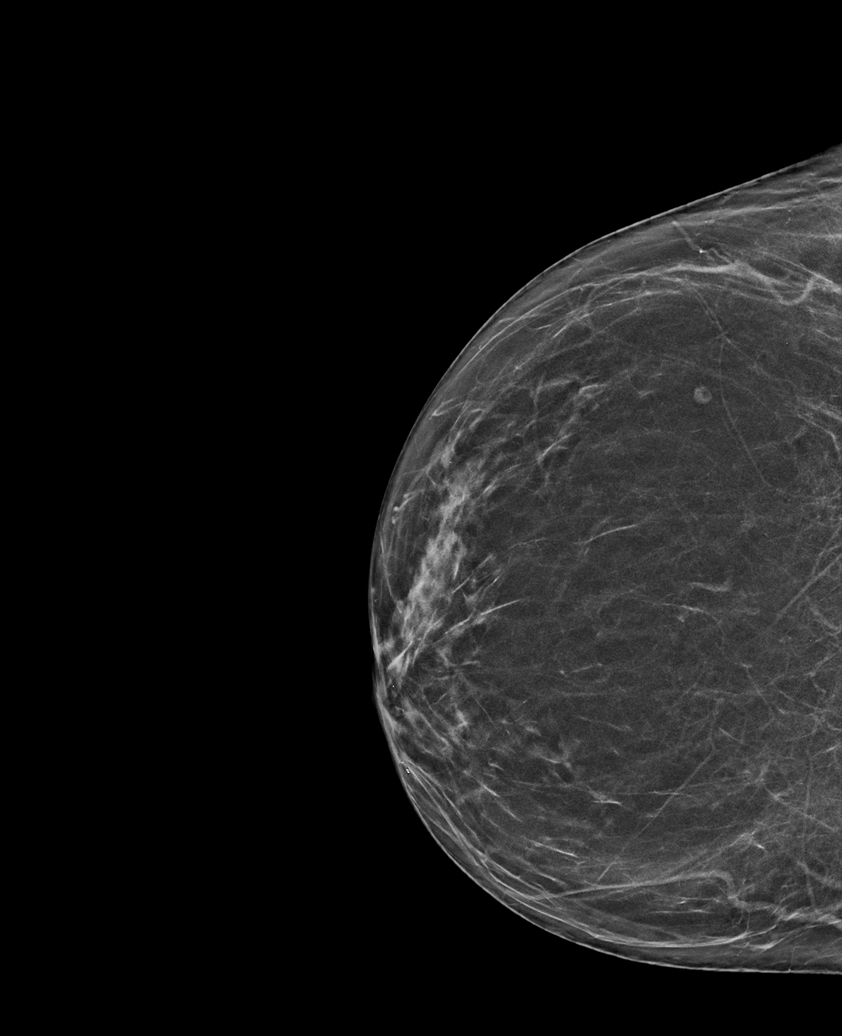

[R MLO synth-2D]
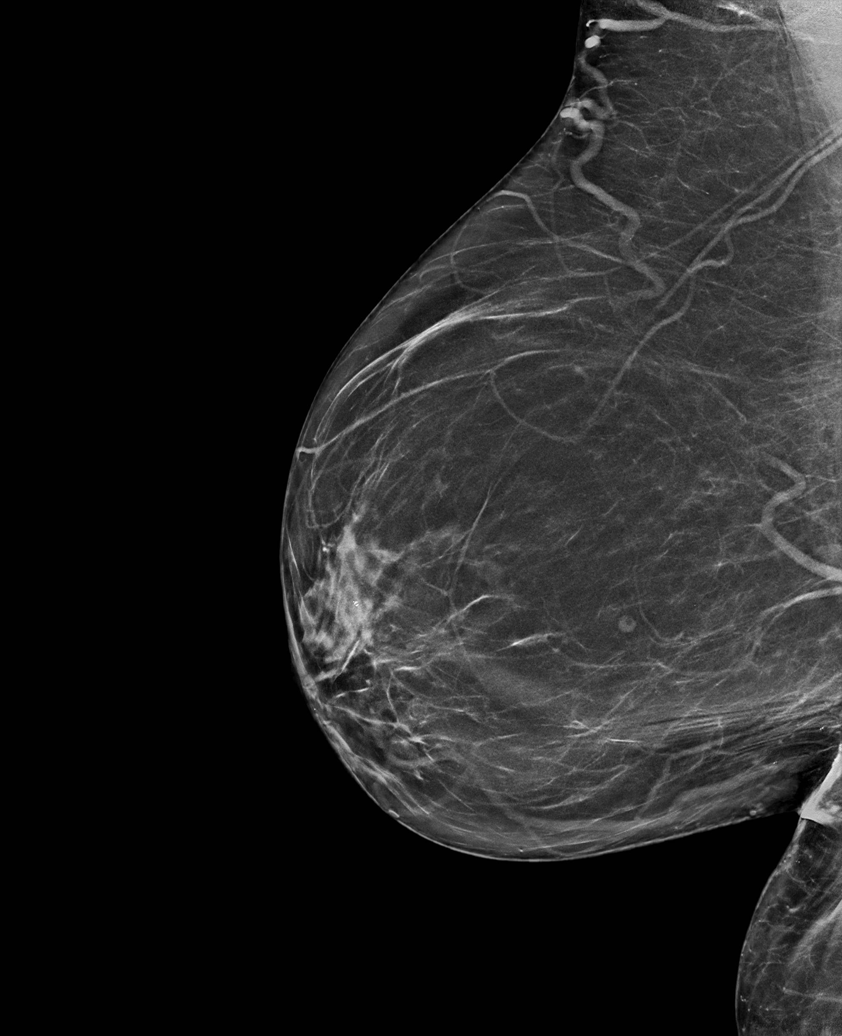

[L CC synth-2D (1 of 2)]
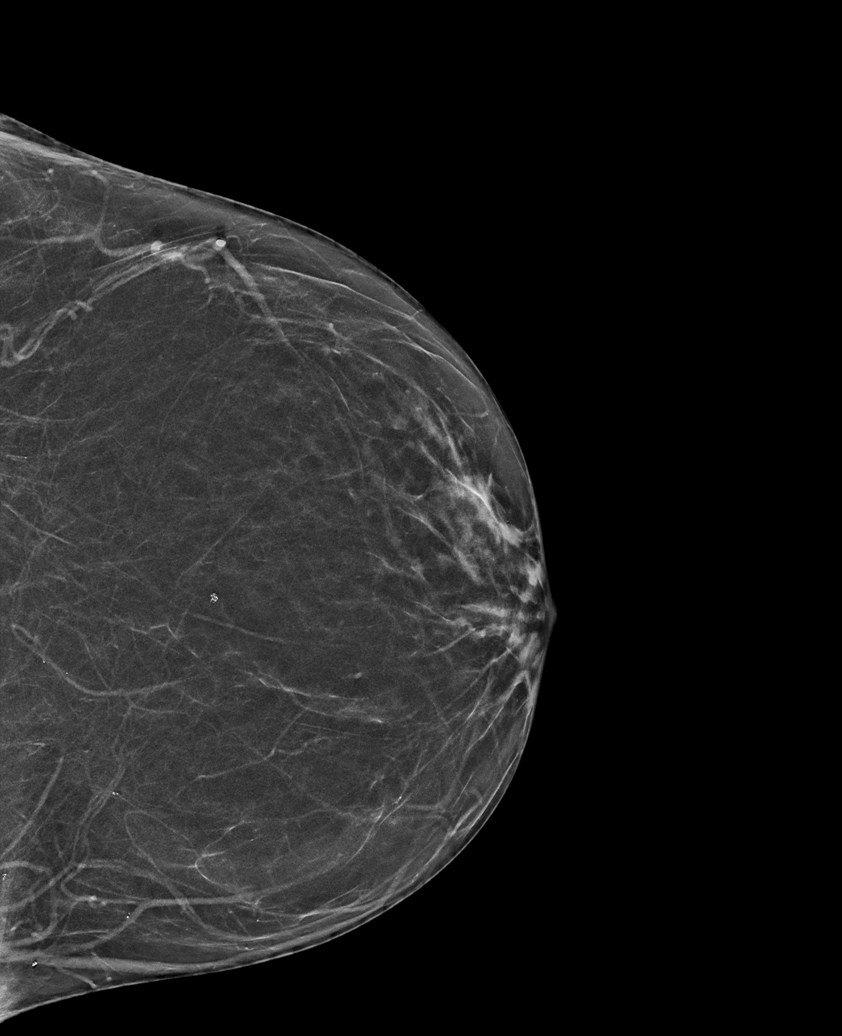

[L CC synth-2D (2 of 2)]
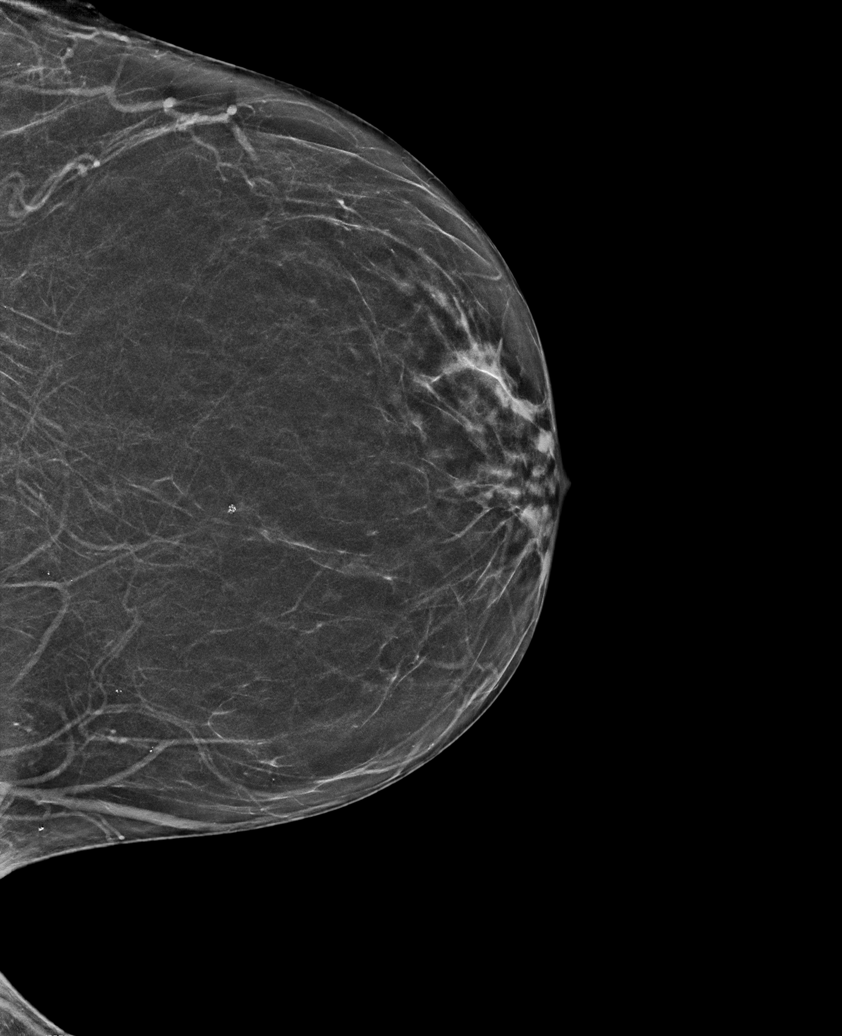

[L CC tomo · tomo slice 29/56.0]
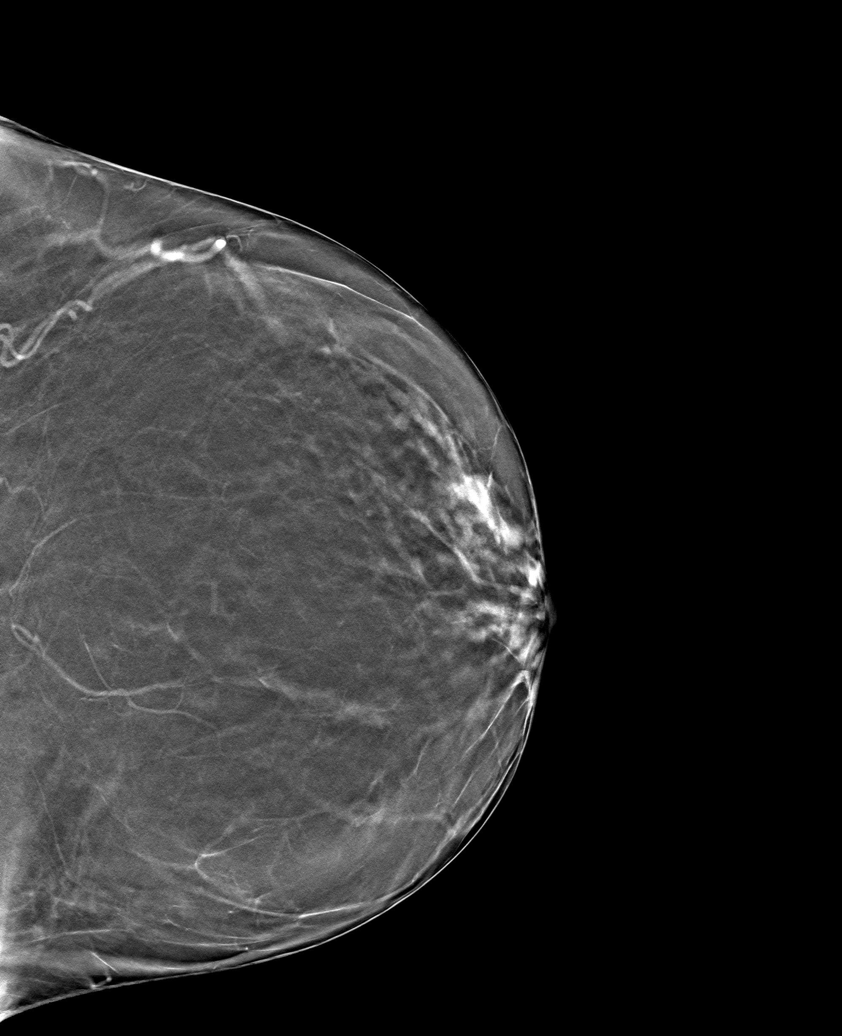

[6 of 30 positions shown; findings below may reference images not displayed]

The patient is unsure when or where her
prior mammograms were performed.

ACR Breast Density Category b: There are scattered areas of
fibroglandular density.
FINDINGS: There are no findings suspicious for malignancy.
IMPRESSION: No mammographic evidence of malignancy. A result letter of this
screening mammogram will be mailed directly to the patient.

RECOMMENDATION:
Screening mammogram in one year. (Code:CF-6-SRL)

BI-RADS CATEGORY  1: Negative.
